# Patient Record
Sex: Female | Born: 1995 | Hispanic: No | Marital: Single
Health system: Southern US, Community
[De-identification: ages and names within clinical notes are randomized; demographics above are authoritative.]

## PROBLEM LIST (undated history)

## (undated) HISTORY — PX: WISDOM TOOTH EXTRACTION: SHX21

---
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
## 2015-02-19 ENCOUNTER — Emergency Department (HOSPITAL_COMMUNITY)
Admission: EM | Admit: 2015-02-19 | Discharge: 2015-02-19 | Disposition: A | Discharge status: Home / Self Care | Payer: Medicaid Other | Attending: Emergency Medicine | Admitting: Emergency Medicine

## 2015-02-19 ENCOUNTER — Encounter (HOSPITAL_COMMUNITY): Payer: Self-pay | Admitting: *Deleted

## 2015-02-19 DIAGNOSIS — Y289XXA Contact with unspecified sharp object, undetermined intent, initial encounter: Secondary | ICD-10-CM | POA: Insufficient documentation

## 2015-02-19 DIAGNOSIS — Y9389 Activity, other specified: Secondary | ICD-10-CM | POA: Insufficient documentation

## 2015-02-19 DIAGNOSIS — S61215A Laceration without foreign body of left ring finger without damage to nail, initial encounter: Secondary | ICD-10-CM | POA: Insufficient documentation

## 2015-02-19 DIAGNOSIS — S6992XA Unspecified injury of left wrist, hand and finger(s), initial encounter: Secondary | ICD-10-CM | POA: Diagnosis present

## 2015-02-19 DIAGNOSIS — IMO0002 Reserved for concepts with insufficient information to code with codable children: Secondary | ICD-10-CM

## 2015-02-19 DIAGNOSIS — Y929 Unspecified place or not applicable: Secondary | ICD-10-CM | POA: Insufficient documentation

## 2015-02-19 DIAGNOSIS — Y999 Unspecified external cause status: Secondary | ICD-10-CM | POA: Diagnosis not present

## 2015-02-19 MED ORDER — LIDOCAINE HCL (PF) 1 % IJ SOLN
5.0000 mL | Freq: Once | INTRAMUSCULAR | Status: AC
  Administered 2015-02-19: 5 mL via INTRADERMAL
  Filled 2015-02-19: qty 5

## 2015-02-19 MED ORDER — TETANUS-DIPHTH-ACELL PERTUSSIS 5-2.5-18.5 LF-MCG/0.5 IM SUSP
0.5000 mL | Freq: Once | INTRAMUSCULAR | Status: AC
  Administered 2015-02-19: 0.5 mL via INTRAMUSCULAR
  Filled 2015-02-19: qty 0.5

## 2015-02-19 NOTE — Discharge Instructions (Signed)
Please return to the Emergency department in 7 days for suture removal.

## 2015-02-19 NOTE — ED Notes (Signed)
Pt reports cutting her LT ring finger one Hr ago . Pt was cutting food .

## 2015-02-19 NOTE — ED Provider Notes (Signed)
CSN: [id number]     Arrival date & time 02/19/15  1444 History  This chart was scribed for non-physician practitioner, Melburn Hake, PA-C working with Glynn Octave, MD by Angelene Giovanni, ED Scribe. The patient was seen in room TR09C/TR09C and the patient's care was started at 3:15 PM    Chief Complaint  Patient presents with  . Laceration   The history is provided by the patient. No language interpreter was used.   HPI Comments: Deborah Larsen is a 19 y.o. female who presents to the Emergency Department complaining of laceration on her left ring finger that occurred an hour ago while she was cutting a tomato. She notes the laceration was bleeding but has since stopped. Endorses pain to the left ring finger. She does not take any medications. Her tetanus vaccine is not UTD.   History reviewed. No pertinent past medical history. Past Surgical History  Procedure Laterality Date  . Wisdom tooth extraction     History reviewed. No pertinent family history. Social History  Substance Use Topics  . Smoking status: Passive Smoke Exposure - Never Smoker  . Smokeless tobacco: None  . Alcohol Use: No   OB History    No data available     Review of Systems  Skin:       Lac on left ring finger  All other systems reviewed and are negative.     Allergies  Review of patient's allergies indicates no known allergies.  Home Medications   Prior to Admission medications   Not on File   BP 105/62 mmHg  Pulse 75  Temp(Src) 98.4 F (36.9 C) (Oral)  Resp 18  Ht 5\' 2"  (1.575 m)  Wt 120 lb (54.432 kg)  BMI 21.94 kg/m2  SpO2 100%  LMP 01/29/2015 Physical Exam  Constitutional: She is oriented to person, place, and time. She appears well-developed and well-nourished.  HENT:  Head: Normocephalic and atraumatic.  Eyes: Conjunctivae and EOM are normal. Right eye exhibits no discharge. Left eye exhibits no discharge. No scleral icterus.  Neck: Normal range of motion.   Cardiovascular: Normal rate.   Pulmonary/Chest: Effort normal. No respiratory distress.  Musculoskeletal: Normal range of motion.  Sensation intact to left 4th digit. Full ROM and 5/5 strength of left digits. Radial pulses 2+.   Neurological: She is alert and oriented to person, place, and time.  Skin: Skin is warm and dry.  1cm laceration to the distal aspect of the left 4th digit, no active bleeding.  Psychiatric: She has a normal mood and affect. Her behavior is normal.  Nursing note and vitals reviewed.   ED Course  Procedures (including critical care time) DIAGNOSTIC STUDIES: Oxygen Saturation is 100% on RA, normal by my interpretation.    COORDINATION OF CARE: 3:18 PM- Pt advised of plan for treatment and pt agrees.    Labs Review Labs Reviewed - No data to display  Imaging Review No results found. I have personally reviewed and evaluated these images and lab results as part of my medical decision-making.  LACERATION REPAIR Performed by: Barrett Henle Authorized by: Barrett Henle Consent: Verbal consent obtained. Risks and benefits: risks, benefits and alternatives were discussed Consent given by: patient Patient identity confirmed: provided demographic data Prepped and Draped in normal sterile fashion Wound explored  Laceration Location: distal left 4th digit  Laceration Length: 1cm  No Foreign Bodies seen or palpated  Anesthesia: local infiltration  Local anesthetic: lidocaine 1% without epinephrine  Anesthetic total: 1 ml  Irrigation method: syringe Amount of cleaning: standard  Skin closure: 6-0 prolene  Number of sutures: 4  Technique: simple interrupted  Patient tolerance: Patient tolerated the procedure well with no immediate complications.  Filed Vitals:   02/19/15 1453  BP: 105/62  Pulse: 75  Temp: 98.4 F (36.9 C)  Resp: 18      MDM   Final diagnoses:  Laceration    Pt presents with laceration to distal  4th left digit. No active bleeding. Tetanus status unknown.  Pt tolerated laceration repair well and procedure was completed without any complications.  Tdap ordered due to unknown tetanus status.  Discussed plan for d/c with pt. Pt given instructions for care of sutures and advised to return to the ED in 7 days to have sutures removed. Pt agrees with plan. Pt advised to return to the ED sooner if sxs worsen or new onset of fever, dec. ROM, numbness, tingling, weakness or bleeding.   I personally performed the services described in this documentation, which was scribed in my presence. The recorded information has been reviewed and is accurate.  Meds given in ED:  Medications  lidocaine (PF) (XYLOCAINE) 1 % injection 5 mL (not administered)  Tdap (BOOSTRIX) injection 0.5 mL (not administered)    New Prescriptions   No medications on file     Barrett Henle, [place] 02/19/15 1634  Arby Barrette, MD 02/24/15 [phone]

## 2015-02-19 NOTE — ED Notes (Signed)
Dressing placed to the left index finger  Patient verbalized understanding of d/c instructions

## 2015-09-08 ENCOUNTER — Emergency Department (HOSPITAL_COMMUNITY)
Admission: EM | Admit: 2015-09-08 | Discharge: 2015-09-08 | Disposition: A | Discharge status: Home / Self Care | Payer: Medicaid Other | Attending: Emergency Medicine | Admitting: Emergency Medicine

## 2015-09-08 ENCOUNTER — Encounter (HOSPITAL_COMMUNITY): Payer: Self-pay | Admitting: Emergency Medicine

## 2015-09-08 DIAGNOSIS — L03011 Cellulitis of right finger: Secondary | ICD-10-CM | POA: Insufficient documentation

## 2015-09-08 DIAGNOSIS — M79641 Pain in right hand: Secondary | ICD-10-CM | POA: Diagnosis present

## 2015-09-08 MED ORDER — CEPHALEXIN 500 MG PO CAPS: 1000.0000 mg | ORAL_CAPSULE | Freq: Two times a day (BID) | ORAL | Status: DC
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
## 2015-09-08 NOTE — Discharge Instructions (Signed)
Apply warm moist compress to affected finger 3-5 times daily for the next several days.  Take antibiotics as prescribed for the full duration.  Take tylenol or ibuprofen as needed for pain.  Return in 2 days if you notice no improvement.  Celulitis (Cellulitis)  La celulitis es una infeccin de la piel y del tejido que se encuentra debajo de la piel. El rea infectada generalmente est de color rojo y duele. Ocurre con ms frecuencia en los brazos y en las piernas. CUIDADOS EN EL HOGAR   Tome los antibiticos tal como se le indic. Finalice el Dan[place]medicamento, aunque comience a Actorsentirse mejor.  Mantenga el brazo o la pierna infectada elevada.  Aplique paos calientes en la zona hasta 4 veces al da.  Tome slo los medicamentos que le haya indicado el mdico.  Cumpla con los controles mdicos segn las indicaciones. SOLICITE AYUDA SI:  Observa una lnea roja en la piel que sale desde la herida.  El rea roja se extiende o se vuelve de color oscuro.  El hueso o la articulacin que se encuentran por debajo de la zona infectada le duelen despus de que la piel se [place].  La infeccin se repite en la misma zona o en una zona diferente.  Tiene un bulto inflamado (hinchado) en la zona infectada.  Aparecen nuevos sntomas.  Tiene fiebre. SOLICITE AYUDA DE INMEDIATO SI:   Se siente muy somnoliento.  Vomita o tiene diarrea.  Se siente enfermo y tiene Smith Internationaldolores musculares.   Esta informacin no tiene Theme park managercomo fin reemplazar el consejo del mdico. Asegrese de hacerle al mdico cualquier pregunta que tenga.   Document Released: 12/06/2009 Document Revised: 03/09/2015 Elsevier Interactive Patient Education [employer]2016 Elsevier Inc.
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
## 2018-07-02 NOTE — L&D Delivery Note (Signed)
Delivery Note   Patient Name: Deborah Larsen DOB: August 03, 1995 MRN: [id number]  Date of admission: 04/05/2019 Delivering MD: Noralyn Pick  Date of delivery: 04/05/19 Type of delivery: SVD  Newborn Data: Live born female  Birth Weight:   APGAR: 93, 75  Newborn Delivery   Birth date/time: 04/05/2019 17:41:00 Delivery type: Vaginal, Spontaneous     Deborah Larsen, 23 y.o., @ [redacted]w[redacted]d,  G1P0, who was admitted for PROM with spontaneous labor. I was called to the room when she progressed +1 station and 9cm dilated. Pt having all natural birth and was coached to breath through her cxt, but involuntary was pushing. Pt then progressed to +2 station @ 10 cm in the second stage of labor.  She pushed for 2hours.  She delivered a viable infant, cephalic and restituted to the ROT position over an intact perineum.  A loose nuchal cord   was identified and newborn somersaulted through. The baby was placed on maternal abdomen while initial step of NRP were perfmored (Dry, Stimulated, and warmed). Hat placed on baby for thermoregulation. Delayed cord clamping was performed for 2 minutes.  Cord double clamped and cut.  Cord cut by father. Apgar scores were 7 and 9. Prophylactic Pitocin was started in the third stage of labor for active management. Red robin I&O was performed to collect another PCR. The placenta delivered spontaneously, shultz, with a 3 vessel cord and was sent to LD.  Inspection revealed perineal tear, right labial tear, hemostatic no repaired required, pt declines repair also. An examination of the vaginal vault and cervix was free from lacerations. The uterus was boggy and required fundal massage then was firm, bleeding stable.   Placenta and umbilical artery blood gas were not sent.  There were no complications during the procedure.  Mom and baby skin to skin following delivery. Left in stable condition.  Pt stable denies HA, RUQ pain, no vision changes, patellar DTR+2, no clonus. PCR  resulted 2.42, but was clean catch and pt was bleeding at the time of collection, cmp was redrawn and pending, platelets were 140s. See VSS below for BP immediately PP.   BP 112/81   Pulse (!) 103   Temp 98 F (36.7 C) (Oral)   Resp 19   Ht 5\' 2"  (1.575 m)   Wt 73.7 kg   LMP 07/07/2018   SpO2 99%   BMI 29.70 kg/m   Maternal Info: Anesthesia: None Episiotomy: No Lacerations:  perineal tear, right labial tear, hemostatic no repaired required, pt declines repair also.  Suture Repair: No Est. Blood Loss (mL):  445mls  Newborn Info:  Baby Sex: female Circumcision: No, declines Babies Name: [place] APGAR (1 MIN): 7   APGAR (5 MINS): 9   APGAR (10 MINS):     Mom to postpartum.  Baby to Couplet care / Skin to Skin.  DR Simona Huh updated on birth.    [place], [place], NP-C 04/05/19 6:13 PM

## 2018-10-21 LAB — OB RESULTS CONSOLE RUBELLA ANTIBODY, IGM: Rubella: UNDETERMINED

## 2018-10-21 LAB — OB RESULTS CONSOLE RPR: RPR: NONREACTIVE

## 2018-10-21 LAB — OB RESULTS CONSOLE HEPATITIS B SURFACE ANTIGEN: Hepatitis B Surface Ag: NEGATIVE

## 2018-10-22 LAB — OB RESULTS CONSOLE GC/CHLAMYDIA
Chlamydia: NEGATIVE
Gonorrhea: NEGATIVE

## 2018-10-22 LAB — OB RESULTS CONSOLE HIV ANTIBODY (ROUTINE TESTING): HIV: NONREACTIVE

## 2019-01-16 ENCOUNTER — Encounter (HOSPITAL_COMMUNITY): Payer: Self-pay | Admitting: *Deleted

## 2019-01-16 ENCOUNTER — Other Ambulatory Visit: Payer: Self-pay

## 2019-01-16 ENCOUNTER — Inpatient Hospital Stay (HOSPITAL_COMMUNITY)
Admission: AD | Admit: 2019-01-16 | Discharge: 2019-01-16 | Disposition: A | Discharge status: Home / Self Care | Payer: Medicaid Other | Attending: Obstetrics & Gynecology | Admitting: Obstetrics & Gynecology

## 2019-01-16 DIAGNOSIS — Z3A27 27 weeks gestation of pregnancy: Secondary | ICD-10-CM | POA: Insufficient documentation

## 2019-01-16 DIAGNOSIS — O368120 Decreased fetal movements, second trimester, not applicable or unspecified: Secondary | ICD-10-CM | POA: Diagnosis not present

## 2019-01-16 DIAGNOSIS — Z7722 Contact with and (suspected) exposure to environmental tobacco smoke (acute) (chronic): Secondary | ICD-10-CM | POA: Insufficient documentation

## 2019-01-16 NOTE — MAU Note (Signed)
.   Deborah Larsen is a 23 y.o. at Unknown here in MAU reporting:  A decrease in fetal movement for 2 days. PT denies any other complaints  Onset of complaint: 2 days Pain score: 0 Vitals:   01/16/19 1406  BP: (!) 109/58  Pulse: 76  Resp: 16  Temp: 98.7 F (37.1 C)  SpO2: 99%     FHT:145 Lab orders placed from triage:

## 2019-01-16 NOTE — MAU Provider Note (Signed)
History   [id number]   Chief Complaint  Patient presents with  . Decreased Fetal Movement    HPI Deborah Larsen is a 23 y.o. female  G1P0 here with report of decreased fetal movement since 2 days ago.  Reports feeling the baby move approximately 18+ times in the past 24 hour. Pt states she's been feeling the baby move as frequently but the movement is not as "strong" as previously. Denies contractions, vaginal bleeding, or leaking of fluid.  Patient's last menstrual period was 07/07/2018.  OB History  Gravida Para Term Preterm AB Living  1            SAB TAB Ectopic Multiple Live Births               # Outcome Date GA Lbr Len/2nd Weight Sex Delivery Anes PTL Lv  1 Current             History reviewed. No pertinent past medical history.  History reviewed. No pertinent family history.  Social History   Socioeconomic History  . Marital status: Single    Spouse name: Not on file  . Number of children: Not on file  . Years of education: Not on file  . Highest education level: Not on file  Occupational History  . Not on file  Social Needs  . Financial resource strain: Not on file  . Food insecurity    Worry: Not on file    Inability: Not on file  . Transportation needs    Medical: Not on file    Non-medical: Not on file  Tobacco Use  . Smoking status: Passive Smoke Exposure - Never Smoker  . Smokeless tobacco: Never Used  Substance and Sexual Activity  . Alcohol use: No  . Drug use: No  . Sexual activity: Not on file  Lifestyle  . Physical activity    Days per week: Not on file    Minutes per session: Not on file  . Stress: Not on file  Relationships  . Social Herbalist on phone: Not on file    Gets together: Not on file    Attends religious service: Not on file    Active member of club or organization: Not on file    Attends meetings of clubs or organizations: Not on file    Relationship status: Not on file  Other Topics Concern  . Not  on file  Social History Narrative  . Not on file    No Known Allergies  No current facility-administered medications on file prior to encounter.    Current Outpatient Medications on File Prior to Encounter  Medication Sig Dispense Refill  . Prenatal Vit-Fe Fumarate-FA (PRENATAL MULTIVITAMIN) TABS tablet Take 1 tablet by mouth daily at 12 noon.       Review of Systems  Constitutional: Negative.   Gastrointestinal: Negative.   Genitourinary: Negative.    Physical Exam   Vitals:   01/16/19 1406  BP: (!) 109/58  Pulse: 76  Resp: 16  Temp: 98.7 F (37.1 C)  SpO2: 99%    Physical Exam  Nursing note and vitals reviewed. Constitutional: She appears well-developed and well-nourished. No distress.  GI: Soft. There is no abdominal tenderness.  Skin: Skin is warm and dry. She is not diaphoretic.  Psychiatric: She has a normal mood and affect. Her behavior is normal. Judgment and thought content normal.   NST:  Baseline: 145 bpm, Variability: Good {> 6 bpm), Accelerations: Reactive and Decelerations:  Absent MAU Course  Procedures  MDM Category 1 tracing with active movement. Patient reports feeling good fetal movement. Discussed expectations of fetal movement for the remainder of the pregnancy.   Assessment and Plan  A: 1. Decreased fetal movements in second trimester, single or unspecified fetus   2. [redacted] weeks gestation of pregnancy    P: Discharge home Discussed reasons to return to MAU   Judeth HornLawrence, Lavell Ridings, NP 01/16/2019 3:12 PM

## 2019-01-16 NOTE — Discharge Instructions (Signed)

## 2019-03-20 LAB — OB RESULTS CONSOLE GBS: GBS: NEGATIVE

## 2019-03-23 LAB — OB RESULTS CONSOLE GBS: GBS: NEGATIVE

## 2019-04-05 ENCOUNTER — Other Ambulatory Visit: Payer: Self-pay

## 2019-04-05 ENCOUNTER — Encounter (HOSPITAL_COMMUNITY): Payer: Self-pay

## 2019-04-05 ENCOUNTER — Inpatient Hospital Stay (HOSPITAL_COMMUNITY)
Admission: AD | Admit: 2019-04-05 | Discharge: 2019-04-08 | DRG: 806 | Disposition: A | Discharge status: Home / Self Care | Payer: Medicaid Other | Attending: Obstetrics and Gynecology | Admitting: Obstetrics and Gynecology

## 2019-04-05 DIAGNOSIS — Z3A38 38 weeks gestation of pregnancy: Secondary | ICD-10-CM | POA: Diagnosis not present

## 2019-04-05 DIAGNOSIS — O4292 Full-term premature rupture of membranes, unspecified as to length of time between rupture and onset of labor: Principal | ICD-10-CM | POA: Diagnosis present

## 2019-04-05 DIAGNOSIS — Z20828 Contact with and (suspected) exposure to other viral communicable diseases: Secondary | ICD-10-CM | POA: Diagnosis present

## 2019-04-05 DIAGNOSIS — O4290 Premature rupture of membranes, unspecified as to length of time between rupture and onset of labor, unspecified weeks of gestation: Secondary | ICD-10-CM | POA: Diagnosis present

## 2019-04-05 DIAGNOSIS — O1414 Severe pre-eclampsia complicating childbirth: Secondary | ICD-10-CM | POA: Diagnosis present

## 2019-04-05 DIAGNOSIS — O9081 Anemia of the puerperium: Secondary | ICD-10-CM | POA: Diagnosis not present

## 2019-04-05 DIAGNOSIS — O1415 Severe pre-eclampsia, complicating the puerperium: Secondary | ICD-10-CM | POA: Diagnosis not present

## 2019-04-05 LAB — CBC
HCT: 34.7 % — ABNORMAL LOW (ref 36.0–46.0)
Hemoglobin: 11.6 g/dL — ABNORMAL LOW (ref 12.0–15.0)
MCH: 28.5 pg (ref 26.0–34.0)
MCHC: 33.4 g/dL (ref 30.0–36.0)
MCV: 85.3 fL (ref 80.0–100.0)
Platelets: 142 10*3/uL — ABNORMAL LOW (ref 150–400)
RBC: 4.07 MIL/uL (ref 3.87–5.11)
RDW: 13.1 % (ref 11.5–15.5)
WBC: 8.6 10*3/uL (ref 4.0–10.5)
nRBC: 0 % (ref 0.0–0.2)

## 2019-04-05 LAB — COMPREHENSIVE METABOLIC PANEL
ALT: 18 U/L (ref 0–44)
AST: 36 U/L (ref 15–41)
Albumin: 2.8 g/dL — ABNORMAL LOW (ref 3.5–5.0)
Alkaline Phosphatase: 128 U/L — ABNORMAL HIGH (ref 38–126)
Anion gap: 14 (ref 5–15)
BUN: 5 mg/dL — ABNORMAL LOW (ref 6–20)
CO2: 16 mmol/L — ABNORMAL LOW (ref 22–32)
Calcium: 8.5 mg/dL — ABNORMAL LOW (ref 8.9–10.3)
Chloride: 106 mmol/L (ref 98–111)
Creatinine, Ser: 0.97 mg/dL (ref 0.44–1.00)
GFR calc Af Amer: >60 mL/min (ref >60)
GFR calc non Af Amer: >60 mL/min (ref >60)
Glucose, Bld: 115 mg/dL — ABNORMAL HIGH (ref 70–99)
Potassium: 4 mmol/L (ref 3.5–5.1)
Sodium: 136 mmol/L (ref 135–145)
Total Bilirubin: 1.1 mg/dL (ref 0.3–1.2)
Total Protein: 6.3 g/dL — ABNORMAL LOW (ref 6.5–8.1)

## 2019-04-05 LAB — POCT FERN TEST: POCT Fern Test: POSITIVE

## 2019-04-05 LAB — PROTEIN / CREATININE RATIO, URINE
Creatinine, Urine: 144.63 mg/dL
Creatinine, Urine: 107.58 mg/dL
Protein Creatinine Ratio: 2.42 mg/mg{Cre} — ABNORMAL HIGH (ref 0.00–0.15)
Protein Creatinine Ratio: 3.72 mg/mg{Cre} — ABNORMAL HIGH (ref 0.00–0.15)
Total Protein, Urine: 350 mg/dL
Total Protein, Urine: 400 mg/dL

## 2019-04-05 LAB — LACTATE DEHYDROGENASE: LDH: 209 U/L — ABNORMAL HIGH (ref 98–192)

## 2019-04-05 LAB — SARS CORONAVIRUS 2 BY RT PCR (HOSPITAL ORDER, PERFORMED IN ~~LOC~~ HOSPITAL LAB): SARS Coronavirus 2: NEGATIVE

## 2019-04-05 LAB — URIC ACID: Uric Acid, Serum: 6.4 mg/dL (ref 2.5–7.1)

## 2019-04-05 LAB — ABO/RH: ABO/RH(D): O POS

## 2019-04-05 LAB — RPR: RPR Ser Ql: NONREACTIVE

## 2019-04-05 MED ORDER — SENNOSIDES-DOCUSATE SODIUM 8.6-50 MG PO TABS
2.0000 | ORAL_TABLET | ORAL | Status: DC
  Administered 2019-04-06 – 2019-04-07 (×3): 2 via ORAL
  Filled 2019-04-05 (×3): qty 2

## 2019-04-05 MED ORDER — LACTATED RINGERS IV SOLN
500.0000 mL | INTRAVENOUS | Status: DC | PRN
  Administered 2019-04-05: 1000 mL via INTRAVENOUS

## 2019-04-05 MED ORDER — OXYTOCIN BOLUS FROM INFUSION
500.0000 mL | Freq: Once | INTRAVENOUS | Status: AC
  Administered 2019-04-05: 500 mL via INTRAVENOUS

## 2019-04-05 MED ORDER — DIBUCAINE (PERIANAL) 1 % EX OINT: 1.0000 "application " | TOPICAL_OINTMENT | CUTANEOUS | Status: DC | PRN

## 2019-04-05 MED ORDER — PRENATAL MULTIVITAMIN CH
1.0000 | ORAL_TABLET | Freq: Every day | ORAL | Status: DC
  Administered 2019-04-06 – 2019-04-07 (×2): 1 via ORAL
  Filled 2019-04-05 (×2): qty 1

## 2019-04-05 MED ORDER — OXYTOCIN 40 UNITS IN NORMAL SALINE INFUSION - SIMPLE MED
2.5000 [IU]/h | INTRAVENOUS | Status: DC
  Filled 2019-04-05: qty 1000

## 2019-04-05 MED ORDER — SIMETHICONE 80 MG PO CHEW: 80.0000 mg | CHEWABLE_TABLET | ORAL | Status: DC | PRN

## 2019-04-05 MED ORDER — ZOLPIDEM TARTRATE 5 MG PO TABS: 5.0000 mg | ORAL_TABLET | Freq: Every evening | ORAL | Status: DC | PRN

## 2019-04-05 MED ORDER — SOD CITRATE-CITRIC ACID 500-334 MG/5ML PO SOLN: 30.0000 mL | ORAL | Status: DC | PRN

## 2019-04-05 MED ORDER — IBUPROFEN 600 MG PO TABS
600.0000 mg | ORAL_TABLET | Freq: Four times a day (QID) | ORAL | Status: DC
  Administered 2019-04-06 – 2019-04-08 (×9): 600 mg via ORAL
  Filled 2019-04-05 (×9): qty 1

## 2019-04-05 MED ORDER — OXYCODONE-ACETAMINOPHEN 5-325 MG PO TABS: 1.0000 | ORAL_TABLET | ORAL | Status: DC | PRN

## 2019-04-05 MED ORDER — ONDANSETRON HCL 4 MG PO TABS: 4.0000 mg | ORAL_TABLET | ORAL | Status: DC | PRN

## 2019-04-05 MED ORDER — ACETAMINOPHEN 325 MG PO TABS: 650.0000 mg | ORAL_TABLET | ORAL | Status: DC | PRN

## 2019-04-05 MED ORDER — TETANUS-DIPHTH-ACELL PERTUSSIS 5-2.5-18.5 LF-MCG/0.5 IM SUSP: 0.5000 mL | Freq: Once | INTRAMUSCULAR | Status: DC

## 2019-04-05 MED ORDER — ONDANSETRON HCL 4 MG/2ML IJ SOLN: 4.0000 mg | Freq: Four times a day (QID) | INTRAMUSCULAR | Status: DC | PRN

## 2019-04-05 MED ORDER — COCONUT OIL OIL: 1.0000 "application " | TOPICAL_OIL | Status: DC | PRN

## 2019-04-05 MED ORDER — ONDANSETRON HCL 4 MG/2ML IJ SOLN: 4.0000 mg | INTRAMUSCULAR | Status: DC | PRN

## 2019-04-05 MED ORDER — BENZOCAINE-MENTHOL 20-0.5 % EX AERO
1.0000 "application " | INHALATION_SPRAY | CUTANEOUS | Status: DC | PRN
  Administered 2019-04-05: 1 via TOPICAL
  Filled 2019-04-05: qty 56

## 2019-04-05 MED ORDER — LIDOCAINE HCL (PF) 1 % IJ SOLN
30.0000 mL | INTRAMUSCULAR | Status: AC | PRN
  Administered 2019-04-05: 30 mL via SUBCUTANEOUS
  Filled 2019-04-05: qty 30

## 2019-04-05 MED ORDER — LACTATED RINGERS IV SOLN
INTRAVENOUS | Status: DC
  Administered 2019-04-05: 12:00:00 via INTRAVENOUS

## 2019-04-05 MED ORDER — OXYCODONE-ACETAMINOPHEN 5-325 MG PO TABS: 2.0000 | ORAL_TABLET | ORAL | Status: DC | PRN

## 2019-04-05 MED ORDER — DIPHENHYDRAMINE HCL 25 MG PO CAPS: 25.0000 mg | ORAL_CAPSULE | Freq: Four times a day (QID) | ORAL | Status: DC | PRN

## 2019-04-05 MED ORDER — MISOPROSTOL 200 MCG PO TABS
ORAL_TABLET | ORAL | Status: AC
  Filled 2019-04-05: qty 4

## 2019-04-05 MED ORDER — WITCH HAZEL-GLYCERIN EX PADS: 1.0000 "application " | MEDICATED_PAD | CUTANEOUS | Status: DC | PRN

## 2019-04-05 NOTE — Progress Notes (Signed)
Labor Progress Note  Matraca Hunkins is a 23 y.o. female, G1P0000, IUP at 38.6 weeks, presenting for PROM # 0300 on 10/4, then pt started feeling cxt after. PNH: Rubella non-immune, baby female, no circ desired, otherwise uncomplicated. Pt endorse + Fm.  Denies vaginal bleeding.   Subjective: Pt in bed in NAD, pt stable with husband at bedside. Pt feeling cxt, but able to breathing, talk and giggle through them. Pt declining pain meds at this time.   Patient Active Problem List   Diagnosis Date Noted  . PROM (premature rupture of membranes) 04/05/2019   Objective: BP 119/87   Pulse 72   Temp 98.4 F (36.9 C) (Oral)   Resp 17   Ht 5\' 2"  (1.575 m)   Wt 73.7 kg   LMP 07/07/2018   SpO2 99%   BMI 29.70 kg/m  No intake/output data recorded. No intake/output data recorded. NST: FHR baseline 130 bpm, Variability: moderate, Accelerations:present, Decelerations:  Absent= Cat 1/Reactive CTX:  regular, every 2-4 minutes, lasting 50-100 seconds Uterus gravid, soft non tender, moderate to palpate with contractions.  SVE:  Dilation: 4.5 Effacement (%): 90 Station: 0 Exam by:: [place], CNM  Assessment:  Dearra Myhand is a 23 y.o. female, G1P0000, IUP at 38.6 weeks, presenting for PROM # 0300 on 10/4, then pt started feeling cxt after. PNH: Rubella non-immune, baby female, no circ desired, otherwise uncomplicated. Pt endorse + Fm.  Denies vaginal bleeding. Progressing natural in latent labor, tolerating natural labor very well.  Patient Active Problem List   Diagnosis Date Noted  . PROM (premature rupture of membranes) 04/05/2019   NICHD: Category 1  Membranes:  PROM @ 0300 on 10/4 x 7hrs, no s/s of infection  Pain management:               IV pain management: x0             Epidural placement:  PRN: not placed, declines wanting.   GBS Negative  Plan: Continue labor plan Continuous/intermittent monitoring Rest Ambulate Frequent position changes to facilitate fetal  rotation and descent. Will reassess with cervical exam at 1330 or earlier if necessary Continue pitocin per protocol Anticipate labor progression and vaginal delivery.   Noralyn Pick, NP-C, CNM, MSN 04/05/2019. 9:43 AM

## 2019-04-05 NOTE — Progress Notes (Signed)
Labor Progress Note  Deborah Larsen is a 23 y.o. female, G1P0000, IUP at 38.6 weeks, presenting for PROM # 0300 on 10/4, then pt started feeling cxt after. PNH: Rubella non-immune, baby female, no circ desired, otherwise uncomplicated. Pt endorse + Fm.  Denies vaginal bleeding.   Subjective: Pt in bed in NAD, pt stable with husband at bedside. Pt feeling cxt and endorses they are more intense now. Pt having to pace breath through them. Pt still declines pain meds and tolerating labor well. RN reported several elevated BP in 140/90s, pt denies HA< vision changes, no RUQ pain.  Patient Active Problem List   Diagnosis Date Noted  . PROM (premature rupture of membranes) 04/05/2019   Objective: BP (!) 146/94   Pulse 68   Temp 98.7 F (37.1 C) (Oral)   Resp 18   Ht 5\' 2"  (1.575 m)   Wt 73.7 kg   LMP 07/07/2018   SpO2 99%   BMI 29.70 kg/m  No intake/output data recorded. No intake/output data recorded. NST: FHR baseline 130 bpm, Variability: moderate, Accelerations:present, Decelerations:  Absent= Cat 1/Reactive CTX:  regular, every 2-4 minutes, lasting 50-100 seconds Uterus gravid, soft non tender, moderate to palpate with contractions.  SVE:  Dilation: 5.5 Effacement (%): 100 Station: 0 Exam by:: Deborah Larsen and Deborah Larsen, RNs   Assessment:  Deborah Larsen is a 23 y.o. female, G1P0000, IUP at 38.6 weeks, presenting for PROM # 0300 on 10/4, then pt started feeling cxt after. PNH: Rubella non-immune, baby female, no circ desired, otherwise uncomplicated. Pt endorse + Fm.  Denies vaginal bleeding. Progressing natural in active labor, tolerating natural labor very well.  Patient Active Problem List   Diagnosis Date Noted  . PROM (premature rupture of membranes) 04/05/2019   NICHD: Category 1  Membranes:  PROM @ 0300 on 10/4 x 7hrs, no s/s of infection  Pain management:               IV pain management: x0             Epidural placement:  PRN: not placed, declines  wanting.   GBS Negative  Elevated BP: Most likely due to pain with cxt. No pain control desired. Asymptomatic.   Plan: Continue labor plan Elevated BP: Labs PCR, CMP.  Continuous/intermittent monitoring Rest Ambulate Frequent position changes to facilitate fetal rotation and descent. Will reassess with cervical exam at 1330 or earlier if necessary Anticipate labor progression and vaginal delivery.   Noralyn Pick, NP-C, CNM, MSN 04/05/2019. 12:15 PM

## 2019-04-05 NOTE — Lactation Note (Signed)
This note was copied from a baby's chart. Lactation Consultation Note  Patient Name: Deborah Larsen [id number] Date: 04/05/2019 Reason for consult: Initial assessment;Primapara;1st time breastfeeding;Early term 52-38.6wks  5 hours old ETI female who is being exclusively BF by his mother, she's a P1. Mom reported (+) breast changes during the pregnancy. She's familiar with hand expression and has already spoon fed baby with the help of her RN, baby has been having a difficult latch since birth, but mom has plenty of colostrum. She was able to hand express a few drops, LC showed parents how to finger feed baby. LC noticed that mom's nipples are "dimpled" but everted and they compress upon stimulation.  Offered assistance with latch and mom agreed to wake baby up to feed. LC took baby STS to mother's left breast in cross cradle position, he was able to open big and "take the breast" but no sucking reflex elicit at the breast, baby would just cry and "sit" his mouth at the breast.   LC tried some suck training, baby would eventually suck on a gloved finger after lots of stimulation but he will not suck at the breast, every time LC hand express and put baby back to breast, he'd do a few sucks and then stop, he did that for about 5-7 minutes. Reviewed normal newborn behavior, feeding cues, lactogenesis II, size of baby's stomach and cluster feeding.  Feeding plan:  1. Encouraged mom to feed baby STS 8-12 times/24 hours or sooner if feeding cues are present 2. Hand expression and spoon feeding were also encouraged, especially if baby is having difficulty latching on  BF brochure (SP), BF resources (SP) and feeding diary (SP) were reviewed. Parents reported all questions and concerns were answered, they're both aware of [facility] services and will call PRN.  Maternal Data Formula Feeding for Exclusion: Yes Reason for exclusion: Mother's choice to formula and breast feed on admission Has patient  been taught Hand Expression?: Yes Does the patient have breastfeeding experience prior to this delivery?: No  Feeding Feeding Type: Breast Fed  LATCH Score Latch: Repeated attempts needed to sustain latch, nipple held in mouth throughout feeding, stimulation needed to elicit sucking reflex.  Audible Swallowing: None  Type of Nipple: Everted at rest and after stimulation(they are everted but "dimpled" and evert upon stimulation)  Comfort (Breast/Nipple): Soft / non-tender  Hold (Positioning): Assistance needed to correctly position infant at breast and maintain latch.  LATCH Score: 6  Interventions Interventions: Breast feeding basics reviewed;Assisted with latch;Skin to skin;Breast massage;Hand express;Breast compression;Adjust position;Support pillows  Lactation Tools Discussed/Used WIC Program: No   Consult Status Consult Status: Follow-up Date: 04/06/19 Follow-up type: In-patient    Shandricka Monroy Francene Boyers 04/05/2019, 11:01 PM

## 2019-04-05 NOTE — H&P (Signed)
Deborah Larsen is a 23 y.o. female, G1P0000, IUP at 38.6 weeks, presenting for PROM # 0300 on 10/4, then pt started feeling cxt after. PNH: Rubella non-immune, baby female, no circ desired, otherwise uncomplicated. Pt endorse + Fm.  Denies vaginal bleeding.   Patient Active Problem List   Diagnosis Date Noted  . PROM (premature rupture of membranes) 04/05/2019     Medications Prior to Admission  Medication Sig Dispense Refill Last Dose  . Prenatal Vit-Fe Fumarate-FA (PRENATAL MULTIVITAMIN) TABS tablet Take 1 tablet by mouth daily at 12 noon.   04/04/2019 at 1000    History reviewed. No pertinent past medical history.   No current facility-administered medications on file prior to encounter.    Current Outpatient Medications on File Prior to Encounter  Medication Sig Dispense Refill  . Prenatal Vit-Fe Fumarate-FA (PRENATAL MULTIVITAMIN) TABS tablet Take 1 tablet by mouth daily at 12 noon.       No Known Allergies  History of present pregnancy: Pt Info/Preference:  Screening/Consents:  Labs:   EDD: Estimated Date of Delivery: 04/13/19  Establised: Patient's last menstrual period was 07/07/2018.  Anatomy Scan: Date: 11/26/2018 Placenta Location: anterior Genetic Screen: Panoroma:low risk female AFP: neg First Tri: Quad:  Office: ccob            First PNV: 16.1wg Blood Type --/--/O POS, O POS Performed at [facility], [place] [address], [place],  [address]  [phone]/04 0550)  Language: english Last PNV: 37.6 wg Rhogam  n/a  Flu Vaccine:  utd   Antibody NEG (10/04 0550)  TDaP vaccine utd   GTT: Early: 5.3 hga1c Third Trimester: 107  Feeding Plan: Breast/bottle BTL: no Rubella: Equivocal (04/21 0000)  Contraception: ??? VBAC: no RPR: Nonreactive (04/21 0000)   Circumcision: no   HBsAg: Negative (04/21 0000)  Pediatrician:  ???   HIV: Non-reactive (04/22 0000)   Prenatal Classes: no Additional [place]: 02/03/2019 growth see below GBS: Negative/-- (09/21 0000)(For PCN allergy,  check sensitivities)       Chlamydia: neg    MFM Referral/Consult:  GC: neg  Support Person: husband   PAP: 2018-normal  Pain Management: None planned, options reveiwed Neonatologist Referral:  Hgb Electrophoresis:  AA  Birth Plan: none   Hgb NOB: 12.4    28W: 12.4  02/03/2019 growth:  OB History    Gravida  1   Para      Term      Preterm      AB      Living        SAB      TAB      Ectopic      Multiple      Live Births             History reviewed. No pertinent past medical history. Past Surgical History:  Procedure Laterality Date  . WISDOM TOOTH EXTRACTION     Family History: family history is not on file. Social History:  reports that she is a non-smoker but has been exposed to tobacco smoke. She has never used smokeless tobacco. She reports that she does not drink alcohol or use drugs.   Prenatal Transfer Tool  Maternal Diabetes: No Genetic Screening: Normal Maternal Ultrasounds/Referrals: Normal Fetal Ultrasounds or other Referrals:  None Maternal Substance Abuse:  No Significant Maternal Medications:  None Significant Maternal Lab Results: Group B Strep negative  ROS:  Review of Systems  Constitutional: Negative.   HENT: Negative.   Eyes: Negative.  Respiratory: Negative.   Cardiovascular: Negative.   Gastrointestinal: Positive for abdominal pain.  Genitourinary: Negative.   Musculoskeletal: Negative.   Skin: Negative.   Neurological: Negative.   Endo/Heme/Allergies: Negative.   Psychiatric/Behavioral: Negative.      Physical Exam: BP (!) 144/81   Pulse (!) 57   Temp 98.4 F (36.9 C) (Oral)   Resp 17   Ht 5\' 2"  (1.575 m)   Wt 73.7 kg   LMP 07/07/2018   SpO2 99%   BMI 29.70 kg/m   Physical Exam  Constitutional: She is oriented to person, place, and time and well-developed, well-nourished, and in no distress.  HENT:  Head: Normocephalic and atraumatic.  Eyes: Pupils are equal, round, and reactive to light.  Neck: Normal  range of motion. Neck supple.  Cardiovascular: Normal rate and regular rhythm.  Pulmonary/Chest: Effort normal and breath sounds normal.  Abdominal: Soft. Bowel sounds are normal.  Genitourinary:    Genitourinary Comments: Uterus soft non-tender, pelvis adequate for vaginal delivery   Musculoskeletal: Normal range of motion.  Neurological: She is alert and oriented to person, place, and time. She has normal reflexes. Gait normal.  Skin: Skin is warm and dry.  Psychiatric: Affect normal.  Nursing note and vitals reviewed.    NST: FHR baseline 130 bpm, Variability: moderate, Accelerations:present, Decelerations:  Absent= Cat 1/Reactive UC:   irregular, every 2-6 minutes, lasting 50-120 seconds, mild to palpate SVE:   Dilation: 3.5 Effacement (%): 50 Station: -3 Exam by:: Suezanne JacquetJ Williams, RN, vertex verified by fetal sutures.  Leopold's: Position vertex, EFW 7.5lbs via leopold's.   Labs: Results for orders placed or performed during the hospital encounter of 04/05/19 (from the past 24 hour(s))  Fern Test     Status: None   Collection Time: 04/05/19  5:16 AM  Result Value Ref Range   POCT Fern Test Positive = ruptured amniotic membanes   SARS Coronavirus 2 [facility](Hospital order, Performed in [facility] hospital lab) Nasopharyngeal Nasopharyngeal Swab     Status: None   Collection Time: 04/05/19  5:30 AM   Specimen: Nasopharyngeal Swab  Result Value Ref Range   SARS Coronavirus 2 NEGATIVE NEGATIVE  CBC     Status: Abnormal   Collection Time: 04/05/19  5:50 AM  Result Value Ref Range   WBC 8.6 4.0 - 10.5 K/uL   RBC 4.07 3.87 - 5.11 MIL/uL   Hemoglobin 11.6 (L) 12.0 - 15.0 g/dL   HCT 95.634.7 (L) 21.336.0 - 08.646.0 %   MCV 85.3 80.0 - 100.0 fL   MCH 28.5 26.0 - 34.0 pg   MCHC 33.4 30.0 - 36.0 g/dL   RDW 57.813.1 46.911.5 - 62.915.5 %   Platelets 142 (L) 150 - 400 K/uL   nRBC 0.0 0.0 - 0.2 %  Type and screen [facility]     Status: None   Collection Time: 04/05/19  5:50 AM  Result Value Ref  Range   ABO/RH(D) O POS    Antibody Screen NEG    Sample Expiration      04/08/2019,2359 Performed at [facility], [address] [place], [place] [address]   ABO/Rh     Status: None (Preliminary result)   Collection Time: 04/05/19  5:50 AM  Result Value Ref Range   ABO/RH(D)      O POS Performed at [facility], [address] [place], [place] [address]     Imaging:  No results found.  MAU Course: Orders Placed This Encounter  Procedures  . SARS Coronavirus 2 [facility] order, Performed in [facility] hospital lab) Nasopharyngeal Nasopharyngeal Swab  . OB RESULT CONSOLE Group B Strep  . OB RESULT CONSOLE Group B Strep  . CBC  . RPR  . OB RESULTS CONSOLE GC/Chlamydia  . OB RESULTS CONSOLE RPR  . OB RESULTS CONSOLE HIV antibody  . OB RESULTS CONSOLE Rubella Antibody  . OB RESULTS CONSOLE Hepatitis B surface antigen  . Diet clear liquid Room service appropriate? Yes; Fluid consistency: Thin  . Vitals signs per unit policy  . Notify Physician  . Fetal monitoring per unit policy  . Activity as tolerated  . Cervical Exam  . Measure blood pressure post delivery every 15 min x 1 hour then every 30 min x 1 hour  . Fundal check post delivery every 15 min x 1 hour then every 30 min x 1 hour  . If Rapid HIV test positive or known HIV positive: initiate AZT orders  . May in and out cath x 2 for inability to void  . Insert foley catheter  . Discontinue foley prior to vaginal delivery  . Initiate Carrier Fluid Protocol  . Initiate Oral Care Protocol  . Order Rapid HIV per protocol if no results on chart  . Practitioner attestation of consent  . Patient may have epidural placement upon request  . Full code  . Fern Test  . Type and screen [facility] (1-Rh)  . ABO/Rh  . Insert and maintain IV Line  . Admit to Inpatient (patient's expected length of stay will be greater than 2 midnights or inpatient only procedure)   Meds ordered this encounter   Medications  . lactated ringers infusion  . oxytocin (PITOCIN) IV BOLUS FROM BAG  . oxytocin (PITOCIN) IV infusion 40 units in NS 1000 mL - Premix  . lactated ringers infusion 500-1,000 mL  . acetaminophen (TYLENOL) tablet 650 mg  . oxyCODONE-acetaminophen (PERCOCET/ROXICET) 5-325 MG per tablet 1 tablet  . oxyCODONE-acetaminophen (PERCOCET/ROXICET) 5-325 MG per tablet 2 tablet  . ondansetron (ZOFRAN) injection 4 mg  . sodium citrate-citric acid (ORACIT) solution 30 mL  . lidocaine (PF) (XYLOCAINE) 1 % injection 30 mL    Assessment/Plan: Deborah Larsen is a 23 y.o. female, G1P0000, IUP at 38.6 weeks, presenting for PROM # 0300 on 10/4, then pt started feeling cxt after. PNH: Rubella non-immune, baby female, no circ desired, GBS-, otherwise uncomplicated. Pt desired all natural delivery. Pt endorse + Fm.  Denies vaginal bleeding.   I discussed with patient risks, benefits and alternatives of labor aumention including higher risk of cesarean delivery compared to spontaneous labor.  We discussed risks of induction agents including effects on fetal heart beat, contraction pattern and need for close monitoring.  Patient expressed understanding of all this and desired to proceed with the expectant management. Risks and benefits of induction were reviewed, including failure of method, prolonged labor, need for further intervention, risk of cesarean.  Patient and family verbalized understanding and denies any further questions at this time. Pt and family wish to proceed with expected management process. Discussed induction options of pitocin were reviewed as well as risks and benefits with use of each discussed.  FWB: Cat 1 Fetal Tracing.   Plan: Admit to Birthing Suite per consult with DR Dion Body Routine [facility] orders Pain med/epidural prn Expectant management  Anticipate starting pitocin if indicated per pts consent.  Anticipate labor progression   Dale Secaucus NP-C, CNM, MSN 04/05/2019,  7:05 AM

## 2019-04-05 NOTE — MAU Note (Signed)
Pt present to MAU for water broken, she states she woke up from sleeping at 3am with water trickling out and it has continued to come out since, she has good fetal movement, and has some light watery pink when she wipes. She states she is feeling some contractions.

## 2019-04-06 DIAGNOSIS — O1415 Severe pre-eclampsia, complicating the puerperium: Secondary | ICD-10-CM | POA: Diagnosis not present

## 2019-04-06 LAB — COMPREHENSIVE METABOLIC PANEL
ALT: 22 U/L (ref 0–44)
AST: 59 U/L — ABNORMAL HIGH (ref 15–41)
Albumin: 2.1 g/dL — ABNORMAL LOW (ref 3.5–5.0)
Alkaline Phosphatase: 96 U/L (ref 38–126)
Anion gap: 8 (ref 5–15)
BUN: 5 mg/dL — ABNORMAL LOW (ref 6–20)
CO2: 21 mmol/L — ABNORMAL LOW (ref 22–32)
Calcium: 8 mg/dL — ABNORMAL LOW (ref 8.9–10.3)
Chloride: 105 mmol/L (ref 98–111)
Creatinine, Ser: 0.98 mg/dL (ref 0.44–1.00)
GFR calc Af Amer: >60 mL/min (ref >60)
GFR calc non Af Amer: >60 mL/min (ref >60)
Glucose, Bld: 109 mg/dL — ABNORMAL HIGH (ref 70–99)
Potassium: 4 mmol/L (ref 3.5–5.1)
Sodium: 134 mmol/L — ABNORMAL LOW (ref 135–145)
Total Bilirubin: 0.3 mg/dL (ref 0.3–1.2)
Total Protein: 4.7 g/dL — ABNORMAL LOW (ref 6.5–8.1)

## 2019-04-06 LAB — CBC
HCT: 27.8 % — ABNORMAL LOW (ref 36.0–46.0)
Hemoglobin: 9.6 g/dL — ABNORMAL LOW (ref 12.0–15.0)
MCH: 29.1 pg (ref 26.0–34.0)
MCHC: 34.5 g/dL (ref 30.0–36.0)
MCV: 84.2 fL (ref 80.0–100.0)
Platelets: 154 10*3/uL (ref 150–400)
RBC: 3.3 MIL/uL — ABNORMAL LOW (ref 3.87–5.11)
RDW: 13.3 % (ref 11.5–15.5)
WBC: 17.3 10*3/uL — ABNORMAL HIGH (ref 4.0–10.5)
nRBC: 0 % (ref 0.0–0.2)

## 2019-04-06 LAB — URIC ACID: Uric Acid, Serum: 6.7 mg/dL (ref 2.5–7.1)

## 2019-04-06 LAB — LACTATE DEHYDROGENASE: LDH: 217 U/L — ABNORMAL HIGH (ref 98–192)

## 2019-04-06 MED ORDER — DIPHENOXYLATE-ATROPINE 2.5-0.025 MG PO TABS
2.0000 | ORAL_TABLET | Freq: Once | ORAL | Status: AC
  Administered 2019-04-06: 2 via ORAL
  Filled 2019-04-06: qty 2

## 2019-04-06 MED ORDER — CARBOPROST TROMETHAMINE 250 MCG/ML IM SOLN
INTRAMUSCULAR | Status: AC
  Administered 2019-04-06: 03:00:00 250 ug
  Filled 2019-04-06: qty 1

## 2019-04-06 MED ORDER — HYDRALAZINE HCL 20 MG/ML IJ SOLN: 10.0000 mg | INTRAMUSCULAR | Status: DC | PRN

## 2019-04-06 MED ORDER — TRANEXAMIC ACID-NACL 1000-0.7 MG/100ML-% IV SOLN
1000.0000 mg | INTRAVENOUS | Status: AC
  Administered 2019-04-06: 1000 mg via INTRAVENOUS

## 2019-04-06 MED ORDER — MAGNESIUM SULFATE 40 G IN LACTATED RINGERS - SIMPLE
2.0000 g/h | INTRAVENOUS | Status: AC
  Administered 2019-04-06 (×2): 2 g/h via INTRAVENOUS
  Filled 2019-04-06 (×4): qty 500

## 2019-04-06 MED ORDER — MISOPROSTOL 200 MCG PO TABS
ORAL_TABLET | ORAL | Status: AC
  Administered 2019-04-06: 1000 ug
  Filled 2019-04-06: qty 5

## 2019-04-06 MED ORDER — METHYLERGONOVINE MALEATE 0.2 MG PO TABS: 0.2000 mg | ORAL_TABLET | Freq: Four times a day (QID) | ORAL | Status: DC

## 2019-04-06 MED ORDER — TRANEXAMIC ACID 1000 MG/10ML IV SOLN
1.5000 mg/kg/h | INTRAVENOUS | Status: DC
  Filled 2019-04-06: qty 25

## 2019-04-06 MED ORDER — TRANEXAMIC ACID-NACL 1000-0.7 MG/100ML-% IV SOLN
INTRAVENOUS | Status: AC
  Filled 2019-04-06: qty 100

## 2019-04-06 MED ORDER — LABETALOL HCL 5 MG/ML IV SOLN: 20.0000 mg | INTRAVENOUS | Status: DC | PRN

## 2019-04-06 MED ORDER — FENTANYL CITRATE (PF) 100 MCG/2ML IJ SOLN
INTRAMUSCULAR | Status: AC
  Administered 2019-04-06: 100 ug
  Filled 2019-04-06: qty 2

## 2019-04-06 MED ORDER — LABETALOL HCL 5 MG/ML IV SOLN: 40.0000 mg | INTRAVENOUS | Status: DC | PRN

## 2019-04-06 MED ORDER — METHYLERGONOVINE MALEATE 0.2 MG PO TABS
0.2000 mg | ORAL_TABLET | Freq: Four times a day (QID) | ORAL | Status: DC
  Administered 2019-04-06: 0.2 mg via ORAL
  Filled 2019-04-06: qty 1

## 2019-04-06 MED ORDER — MAGNESIUM SULFATE BOLUS VIA INFUSION
4.0000 g | Freq: Once | INTRAVENOUS | Status: AC
  Administered 2019-04-06: 04:00:00 4 g via INTRAVENOUS
  Filled 2019-04-06: qty 500

## 2019-04-06 MED ORDER — LACTATED RINGERS IV SOLN
INTRAVENOUS | Status: DC
  Administered 2019-04-06 (×3): via INTRAVENOUS

## 2019-04-06 MED ORDER — LABETALOL HCL 5 MG/ML IV SOLN: 80.0000 mg | INTRAVENOUS | Status: DC | PRN

## 2019-04-06 NOTE — Lactation Note (Signed)
This note was copied from a baby's chart. Lactation Consultation Note  Patient Name: Deborah Larsen [id number] Date: 04/06/2019    RN had just recently tried to assist with latching, but infant did not show any interest. Mom was interested in me doing hand expression with her again. Only a very small amount of EBM was obtained, which I finger-fed to infant.   Mom was amenable to using a bottle to get infant fed. Paced bottle-feeding was taught using the yellow Similac slow-flow nipple. He bottle fed well with relative ease for his 1st bottle feeding. I provided Mom with more yellow slow-flow nipples & a chart that shows how much infants should be drinking via bottle.   I encouraged Mom to feed "Deborah Larsen" as often as he wants, but try not to let more than 3 hrs to go between feedings. I encouraged Mom to pump whenever Deborah Larsen receives formula.   Note: a couple of times today during my consults, I would observe Deborah Larsen to be swallowing as if he were spitting up, but no emesis was ever visually noted.   A slight divet was noted at the tip of infant's tongue during certain tongue movements with possibly a slightly higher palate.   Deborah Larsen [facility] 04/06/2019, 3:43 PM

## 2019-04-06 NOTE — Progress Notes (Addendum)
PPD #1 SVD   S:  Reports feeling "much better", denies visual changes, denies tinnitis             Tolerating po/ No nausea or vomiting             Bleeding is light, no clots             Pain controlled with acetaminophen and ibuprofen (OTC)             Up ad lib / ambulatory / voiding w/o difficulty  Newborn Breast   O:               VS: BP 124/82 (BP Location: Right Arm)   Pulse 87   Temp 97.8 F (36.6 C) (Oral)   Resp 18   Ht 5\' 2"  (1.575 m)   Wt 73.7 kg   LMP 07/07/2018   SpO2 100%   Breastfeeding Unknown   BMI 29.70 kg/m    LABS:              Recent Labs    04/05/19 0550 04/06/19 0257  WBC 8.6 17.3*  HGB 11.6* 9.6*  PLT 142* 154               Blood type: --/--/O POS, O POS Performed at [facility] [address] [place], [place] [address]  [phone]/04 0550)  Rubella: Equivocal (04/21 0000)                     I&O: Intake/Output      10/04 0701 - 10/05 0700 10/05 0701 - 10/06 0700   P.O. 600    I.V. (mL/kg) 225 (3.1)    Other 50    IV Piggyback 0    Total Intake(mL/kg) 875 (11.9)    Urine (mL/kg/hr) 230 (0.1) 250 (1.9)   Stool 0    Blood 1038    Total Output 1268 250   Net -393 -250        Urine Occurrence 6 x    Stool Occurrence 1 x                  Physical Exam:             Alert and oriented X3  Abdomen: soft, non-tender, non-distended              Fundus: firm, non-tender, midline @ U+1  Perineum: edematous  Lochia: appropriate  Extremities: +1 non-pitting edema, no calf pain or tenderness    A:  PPD # 1 S/P PPH     -fundus firm, bleeding stable Pre-eclampsia     -MgSO4 therapy x24 hrs     -Normotensive     -Negative for neuro symptoms Rubella Equivocal   P:  Routine post partum orders Plan to D/C MgSO4 @ 0400 04/07/19 Call for SBP >160/DBP>110 D/C Methergine series Offer Rubella vaccine prior to discharge    Plan reviewed w/ Dr. Mancel Bale.   Burman Foster, MSN, CNM 04/06/2019 9:00 AM

## 2019-04-06 NOTE — Lactation Note (Signed)
This note was copied from a baby's chart. Lactation Consultation Note Baby 63 hrs old. Not feeding, fussy. RN asked for latching assistance. Mom holding baby STS. Praised mom.  Placed baby on mom's breast baby wouldn't suckle just cry. Baby has molding to head. Encouraged mom no to hold on the head but at base of neck.  Hand expressed few drops of colostrum. Mom feeling gushing of blood, light headed, hot, ringing in her Rt. Ear. Called RN. Mom pale. Laid HOB back. Wet wash cloth applied to forehead. RN to assess mom. Mom feeling bad. RN notified Dr.  Dr. Into assess mom. Code Hemorrhage called. [place] notified charge RN. LC will f/u later.   Patient Name: Deborah Larsen [id number] Date: 04/06/2019 Reason for consult: Follow-up assessment;Primapara;Early term 37-38.6wks   Maternal Data Has patient been taught Hand Expression?: Yes Does the patient have breastfeeding experience prior to this delivery?: No  Feeding    LATCH Score Latch: Too sleepy or reluctant, no latch achieved, no sucking elicited.  Audible Swallowing: None  Type of Nipple: Everted at rest and after stimulation  Comfort (Breast/Nipple): Soft / non-tender  Hold (Positioning): Full assist, staff holds infant at breast  LATCH Score: 4  Interventions Interventions: Breast feeding basics reviewed;Adjust position;Assisted with latch;Support pillows;Skin to skin;Position options;Breast massage;Expressed milk;Hand express;Breast compression  Lactation Tools Discussed/Used     Consult Status Consult Status: Follow-up Date: 04/06/19 Follow-up type: In-patient    Elorah Dewing, [place]ta [place] 04/06/2019, 2:58 AM

## 2019-04-06 NOTE — Lactation Note (Addendum)
This note was copied from a baby's chart. Lactation Consultation Note  Patient Name: Boy Gaylene Moylan [id number] Date: 04/06/2019 Reason for consult: Follow-up assessment  "Pamella Pert" is 67 hrs old. Mom is worried that he is not eating & is thinking about giving formula. I observed no feeding cues from [place]. He is currently in a quiet awake phase, content. I put him skin-to-skin with Mom & he is not rooting, just laying peacefully on her chest.   Hand expression was done with Mom & the resulting amount was spoon-fed & finger-fed to infant. He tolerated it well, but did not show any additional interest in feeding.   Mom will call me when infant shows feeding cues.   Mom was observed to have been using size 24 flanges with pumping. I replaced them with size 27 flanges.   Mom had a PPH of over 1000 mL. Matthias Hughs [facility] 04/06/2019, 11:13 AM

## 2019-04-06 NOTE — Progress Notes (Signed)
Got pt up to walk to bathroom and got her cleaned up. Pt tolerated well, but stated she wanted to keep foley catheter in for now because she was still feeling a little too weak to walk on her own to the bathroom.

## 2019-04-06 NOTE — Lactation Note (Addendum)
This note was copied from a baby's chart. Lactation Consultation Note  Patient Name: Deborah Larsen [id number] Date: 04/06/2019 Reason for consult: Follow-up assessment  Infant placed on Mom's chest after diaper change, but was not interested in feeding & fell asleep. Dad is now doing skin-to-skin with infant.   I observed Mom pumping with the size 27 flanges, which she says are more comfortable. Mom pumped less than 0.5 mL. Infant's last urine (which was his first since birth) looked slightly concentrated. Mom was interested in providing formula. Infant was finger-fed with ease.   Mom does not have [place] & does not yet have a pump at home. I informed her about rental from our gift shop. I washed Mom's pump parts for her & set them in bin to air dry.   Matthias Hughs [facility] 04/06/2019, 12:10 PM

## 2019-04-06 NOTE — Progress Notes (Signed)
Called Burman Foster, CNM about catheter removal. Received verbal order to leave foley in until at least 4 pm. If pt tolerates ambulation well without getting dizzy, foley can be d/c'd at 4 pm. If she does not tolerate it well, foley will need to be kept in until tomorrow morning at 4 am.

## 2019-04-06 NOTE — Progress Notes (Addendum)
In room to assess patient for first 4-hour check.  Patient had not been up to void, despite previous education to void every 1-2 hours to help avoid bleeding, passing clots.  Got patient up to void; when she stood up she felt a "large  Gush", but was able to walk to toilet.  Patient able to void; while patient in bathroom, nurse cleaned up bed.  Patient called out that she was suddenly dizzy; when nurse entered bathroom, patient passed two very large clots into toilet and felt better.  While helping patient with pericare, patient suddenly got dizzy again.  Nurse called for help; 7 nurses/techs responded and we were able to transport her, via stedy, safely to bed. VS WNL. [facility] notified; methergine series ordered.  Nurse gave 0.2 mg methergine PO to patient at 0200. Within minutes, patient called out saying she felt hot and dizzy.  After nurse (plus two other nurse) assessed patient, this nurse called [facility] again, who came to follow up with patient.

## 2019-04-06 NOTE — Progress Notes (Signed)
Subjective: Postpartum Day # 1 : S/P NSVD due to spontaneous labor. Patient up ad lib, denies syncope or dizziness. Reports consuming regular diet without issues and denies N/V. Patient reports 0 bowel movement + passing flatus.  Denies issues with urination, but RN reported pt did not want to get up to use the bathroom so it had been a lapse in time, but RN called and reported pt got out of bed, and there was blood noted on the bed and pt passed a clot in the toilet  Which weight 152mls and reports bleeding in bed was 473mls which puts her at 576mls loss now and is "now lighter." The uterus remained first. RN instructed to start methergine PO series. Total blood loss now combined with after delivery and late PP is 1032mls, pt dx with PP hemorrhage. Patient is breastfeeding and reports going well.  Desires unknwon for postpartum contraception.  Pain is being appropriately managed with use of po meds.   Perineal tear with right labial  Laceration, unrepaired and hemstatic Feeding:  breast Contraceptive plan:  undecided BB: Circ NO circ desired.   Objective: Vital signs in last 24 hours: Patient Vitals for the past 24 hrs:  BP Temp Temp src Pulse Resp SpO2 Height Weight  04/06/19 0204 113/73 98 F (36.7 C) Oral (!) 108 18 100 % - -  04/06/19 0052 125/86 98.7 F (37.1 C) Oral 79 18 100 % - -  04/05/19 2130 131/90 98.7 F (37.1 C) Oral 74 18 100 % - -  04/05/19 2030 (!) 143/91 98.3 F (36.8 C) - 62 18 100 % - -  04/05/19 1916 121/82 - - 79 - - - -  04/05/19 1901 116/78 98.5 F (36.9 C) Oral 92 - - - -  04/05/19 1848 126/77 - - 84 - - - -  04/05/19 1846 (!) 113/102 - - 97 - - - -  04/05/19 1831 112/88 - - (!) 127 - - - -  04/05/19 1817 112/81 - - (!) 103 - - - -  04/05/19 1650 (!) 156/85 98 F (36.7 C) Oral 92 19 - - -  04/05/19 1446 (!) 147/83 99.1 F (37.3 C) - 74 17 - - -  04/05/19 1335 (!) 131/91 - - 79 16 - - -  04/05/19 1300 (!) 153/95 - - 71 19 - - -  04/05/19 1210 (!) 146/94 - -  68 - - - -  04/05/19 1150 (!) 155/97 - - 65 - - - -  04/05/19 1043 139/90 - - 66 - - - -  04/05/19 1035 (!) 138/91 - - 84 18 - - -  04/05/19 0948 127/84 - - 63 17 - - -  04/05/19 0941 (!) 148/90 98.7 F (37.1 C) Oral 74 17 - - -  04/05/19 0802 119/87 - - 72 - - - -  04/05/19 0705 136/88 - - 60 - - - -  04/05/19 9326 (!) 144/81 98.4 F (36.9 C) Oral (!) 57 17 - - -  04/05/19 0510 129/88 - - 68 - 99 % - -  04/05/19 0449 (!) 143/89 98.7 F (37.1 C) Oral 87 18 100 % 5\' 2"  (1.575 m) 73.7 kg     Physical Exam:  General: alert, cooperative, appears stated age and pale Mood/Affect: Fatigued, flat Lungs: clear to auscultation, no wheezes, rales or rhonchi, symmetric air entry.  Heart: normal rate, regular rhythm, normal S1, S2, no murmurs, rubs, clicks or gallops. Breast: breasts appear normal, no suspicious masses, no  skin or nipple changes or axillary nodes. Abdomen:  + bowel sounds, soft, non-tender GU: perineum approximate, healing well. No signs of external hematomas.  Uterine Fundus: Boggy +4 deviated to left Lochia: inappropriate, excessive, with large clots Skin: cool and calmy DVT Evaluation: No evidence of DVT seen on physical exam. Negative Homan's sign. No cords or calf tenderness. No significant calf/ankle edema. Brisk Patellar DTRs, no clonus.   CBC Latest Ref Rng & Units 04/05/2019  WBC 4.0 - 10.5 K/uL 8.6  Hemoglobin 12.0 - 15.0 g/dL 11.6(L)  Hematocrit 36.0 - 46.0 % 34.7(L)  Platelets 150 - 400 K/uL 142(L)    Results for orders placed or performed during the hospital encounter of 04/05/19 (from the past 24 hour(s))  Fern Test     Status: None   Collection Time: 04/05/19  5:16 AM  Result Value Ref Range   POCT Fern Test Positive = ruptured amniotic membanes   SARS Coronavirus 2 [facility] order, Performed in [facility] hospital lab) Nasopharyngeal Nasopharyngeal Swab     Status: None   Collection Time: 04/05/19  5:30 AM   Specimen: Nasopharyngeal Swab  Result  Value Ref Range   SARS Coronavirus 2 NEGATIVE NEGATIVE  CBC     Status: Abnormal   Collection Time: 04/05/19  5:50 AM  Result Value Ref Range   WBC 8.6 4.0 - 10.5 K/uL   RBC 4.07 3.87 - 5.11 MIL/uL   Hemoglobin 11.6 (L) 12.0 - 15.0 g/dL   HCT 31.5 (L) 40.0 - 86.7 %   MCV 85.3 80.0 - 100.0 fL   MCH 28.5 26.0 - 34.0 pg   MCHC 33.4 30.0 - 36.0 g/dL   RDW 61.9 50.9 - 32.6 %   Platelets 142 (L) 150 - 400 K/uL   nRBC 0.0 0.0 - 0.2 %  Type and screen  [facility]     Status: None   Collection Time: 04/05/19  5:50 AM  Result Value Ref Range   ABO/RH(D) O POS    Antibody Screen NEG    Sample Expiration      04/08/2019,2359 Performed at [facility], [address], [place], [place] [address]   RPR     Status: None   Collection Time: 04/05/19  5:50 AM  Result Value Ref Range   RPR Ser Ql NON REACTIVE NON REACTIVE  ABO/Rh     Status: None   Collection Time: 04/05/19  5:50 AM  Result Value Ref Range   ABO/RH(D)      O POS Performed at [facility], [address] [place], [place] [address]   Protein / creatinine ratio, urine     Status: Abnormal   Collection Time: 04/05/19  1:32 PM  Result Value Ref Range   Creatinine, Urine 144.63 mg/dL   Total Protein, Urine 350 mg/dL   Protein Creatinine Ratio 2.42 (H) 0.00 - 0.15 mg/mg[Cre]  Comprehensive metabolic panel     Status: Abnormal   Collection Time: 04/05/19  5:21 PM  Result Value Ref Range   Sodium 136 135 - 145 mmol/L   Potassium 4.0 3.5 - 5.1 mmol/L   Chloride 106 98 - 111 mmol/L   CO2 16 (L) 22 - 32 mmol/L   Glucose, Bld 115 (H) 70 - 99 mg/dL   BUN 5 (L) 6 - 20 mg/dL   Creatinine, Ser 3.38 0.44 - 1.00 mg/dL   Calcium 8.5 (L) 8.9 - 10.3 mg/dL   Total Protein 6.3 (L) 6.5 - 8.1 g/dL  Albumin 2.8 (L) 3.5 - 5.0 g/dL   AST 36 15 - 41 U/L   ALT 18 0 - 44 U/L   Alkaline Phosphatase 128 (H) 38 - 126 U/L   Total Bilirubin 1.1 0.3 - 1.2 mg/dL   GFR calc non Af Amer >60 >60 mL/min   GFR calc Af Amer  >60 >60 mL/min   Anion gap 14 5 - 15  Uric acid     Status: None   Collection Time: 04/05/19  5:21 PM  Result Value Ref Range   Uric Acid, Serum 6.4 2.5 - 7.1 mg/dL  Lactate dehydrogenase     Status: Abnormal   Collection Time: 04/05/19  5:21 PM  Result Value Ref Range   LDH 209 (H) 98 - 192 U/L  Protein / creatinine ratio, urine     Status: Abnormal   Collection Time: 04/05/19  6:10 PM  Result Value Ref Range   Creatinine, Urine 107.58 mg/dL   Total Protein, Urine 400 mg/dL   Protein Creatinine Ratio 3.72 (H) 0.00 - 0.15 mg/mg[Cre]     CBG (last 3)  No results for input(s): GLUCAP in the last 72 hours.   I/O last 3 completed shifts: In: -  Out: 503 [Urine:25; Blood:478]   Assessment Postpartum Day # 1 : S/P NSVD due to spontaneous labor. Was firm @+1 involution @ 0230, per RN, see notes below for further events. breastfeeding. Hemodynamically stable. Delay PP hemorrhage, with total EBL of from delivery and PP @ 0230. Pt stable, had not urinated in a while and got up with dizziness, RN supported her back to bed. Uterus stable now and methergine series started. Pt BP stable 113/71 now, denies HA, RUQ pain or vision changes. Elevated BP of 140/90s during natural labor, labs resulted all unremarkable except PCR clean catch was 2.4 then I&O was 3.7. Pt exam and   Plan: @ 0230 Continue other mgmt as ordered Delayed PP Hemorrhage: strated PO methergine started.  VTE prophylactics: Early ambulated as tolerates.  Pain control: Motrin/Tylenol PRN Breastfeeding and Lactation consult    Addendum 2:38 AM RN called and said pt was seeing floater and feeling dizzy.   I went to the pts room to assess. Pt appeared pale, uterus was boggy +4 deviated to the left. I called a PPH. Cytotec , Hemabate, and TXA were added to her po methergine, foley was placed, firm uterine massage was performed, I swept the vagina and entrance of the cervic after giving of fentanyl The team  arrived, drew labs, I informed lab to draw her morning Preeclampsia labs as well. 1L LR was hung, pt appeared to pink up. BP @ that time was normotensive, pt endorsed seeing floater and having tinnitus in her right ear and right foot numbness.Pt complained about foot going numb while she was pushing on her right side during labor and the feeling has not come back yet. Pt denies HA, RUQ pain. Pt does endorses vision changes and on PE now has brisk reflex, no clonus. Pt denies cp or sob. Lungs CTA Bi-lat. Vaginal edema noted, but no lower extremely edema. On PE her face was broken out into petechiae, pt pushed hard for 2 hours during delivery. Subconjunctival hemorrhages were on in bilateral eyes.   After meds given pt BP was 140/100s.   Blood loss: 159ml+306mls+235mls+185mls+91mls+= total QBL PP of 944; Total from birth and PP is   Dr Dion Body was consulted and aware of pt progress and plan of care, discussed  also preeclampsia and starting magnesium. Pt denies HA, or RUQ pain, but endorses seeing spots and Patellar DTR went from +2 to brisk PP. Taking into consideration the PCR being 3.7, BP elevated during labor even with natural labor, [facility] and treat for PP preeclampsia with mag. Will start 4gm loading dose followed by 2gm/hr.   Labs @ 0330:   CBC: hgb drop from 11.6-9.6, platelets 154 up from 142. CMP: creatinine 0.98 up from 0.97, AST 59 up from 36, ALT 22 up from 18 LDH: 217 up from 209 Uric acid: 6.7 up from 6.4   I talked with the patient about preeclampsia and my concerns, for seizures, pt aware and questions were answer, pt consent to transfer and treatment. Pt now stable in bed, feeling nausea. Husband and baby at bedside getting ready for transfer.   Plan @ 0345 Preeclampsia with severe features: Mag 4gm bolus & 2gm/hr maintinence, [facility], Q4H neuro checks, labetalol protocol PRN for severe range Bps, strict IO. Fluids  maxed to 125mls. PP Hemorrhage: Continue po methergine series. Monitor VS.   Hendrix Console NP-C, CNM 04/06/2019, 2:10 AM  Dr. Su Hiltoberts and Maureen RalphsVivian CNM to be updated on patient status when they assume care at 0700.

## 2019-04-06 NOTE — Progress Notes (Signed)
Patient called out at 0200 complaining dizziness, ringing in ears, occasional floaters.  [facility] [place], CNM notified once again.  Jade coming up to assess

## 2019-04-07 DIAGNOSIS — O9081 Anemia of the puerperium: Secondary | ICD-10-CM | POA: Diagnosis not present

## 2019-04-07 LAB — CBC WITH DIFFERENTIAL/PLATELET
Abs Immature Granulocytes: 0.12 10*3/uL — ABNORMAL HIGH (ref 0.00–0.07)
Basophils Absolute: 0 10*3/uL (ref 0.0–0.1)
Basophils Relative: 0 %
Eosinophils Absolute: 0.1 10*3/uL (ref 0.0–0.5)
Eosinophils Relative: 1 %
HCT: 17.5 % — ABNORMAL LOW (ref 36.0–46.0)
Hemoglobin: 5.9 g/dL — CL (ref 12.0–15.0)
Immature Granulocytes: 1 %
Lymphocytes Relative: 33 %
Lymphs Abs: 3.4 10*3/uL (ref 0.7–4.0)
MCH: 28.8 pg (ref 26.0–34.0)
MCHC: 33.7 g/dL (ref 30.0–36.0)
MCV: 85.4 fL (ref 80.0–100.0)
Monocytes Absolute: 0.6 10*3/uL (ref 0.1–1.0)
Monocytes Relative: 6 %
Neutro Abs: 6 10*3/uL (ref 1.7–7.7)
Neutrophils Relative %: 59 %
Platelets: 124 10*3/uL — ABNORMAL LOW (ref 150–400)
RBC: 2.05 MIL/uL — ABNORMAL LOW (ref 3.87–5.11)
RDW: 13.9 % (ref 11.5–15.5)
WBC: 10.2 10*3/uL (ref 4.0–10.5)
nRBC: 0 % (ref 0.0–0.2)

## 2019-04-07 LAB — CBC
HCT: 26.3 % — ABNORMAL LOW (ref 36.0–46.0)
Hemoglobin: 9.2 g/dL — ABNORMAL LOW (ref 12.0–15.0)
MCH: 29.8 pg (ref 26.0–34.0)
MCHC: 35 g/dL (ref 30.0–36.0)
MCV: 85.1 fL (ref 80.0–100.0)
Platelets: 122 10*3/uL — ABNORMAL LOW (ref 150–400)
RBC: 3.09 MIL/uL — ABNORMAL LOW (ref 3.87–5.11)
RDW: 13.6 % (ref 11.5–15.5)
WBC: 9.2 10*3/uL (ref 4.0–10.5)
nRBC: 0.2 % (ref 0.0–0.2)

## 2019-04-07 LAB — COMPREHENSIVE METABOLIC PANEL
ALT: 28 U/L (ref 0–44)
AST: 62 U/L — ABNORMAL HIGH (ref 15–41)
Albumin: 2.1 g/dL — ABNORMAL LOW (ref 3.5–5.0)
Alkaline Phosphatase: 84 U/L (ref 38–126)
Anion gap: 8 (ref 5–15)
BUN: <5 mg/dL — ABNORMAL LOW (ref 6–20)
CO2: 25 mmol/L (ref 22–32)
Calcium: 6.7 mg/dL — ABNORMAL LOW (ref 8.9–10.3)
Chloride: 104 mmol/L (ref 98–111)
Creatinine, Ser: 0.74 mg/dL (ref 0.44–1.00)
GFR calc Af Amer: >60 mL/min (ref >60)
GFR calc non Af Amer: >60 mL/min (ref >60)
Glucose, Bld: 82 mg/dL (ref 70–99)
Potassium: 3.6 mmol/L (ref 3.5–5.1)
Sodium: 137 mmol/L (ref 135–145)
Total Bilirubin: 0.4 mg/dL (ref 0.3–1.2)
Total Protein: 4.8 g/dL — ABNORMAL LOW (ref 6.5–8.1)

## 2019-04-07 LAB — PREPARE RBC (CROSSMATCH)

## 2019-04-07 MED ORDER — SODIUM CHLORIDE 0.9% IV SOLUTION
Freq: Once | INTRAVENOUS | Status: AC
  Administered 2019-04-07: 12:00:00 via INTRAVENOUS

## 2019-04-07 NOTE — Progress Notes (Signed)
Per blood bank: please repeat Hgb after the 2nd unit PRBC to verify if patient needs the 3rd and 4th units. Burman Foster CNM made aware and will repeat CBC 4 hrs after 2nd unit complete.

## 2019-04-07 NOTE — Lactation Note (Signed)
This note was copied from a baby's chart. Lactation Consultation Note  Patient Name: Deborah Larsen CLEXN'T Date: 04/07/2019 Reason for consult: Follow-up assessment;Mother's request  2154 - 2222 - Deborah Larsen paged for assistance. Her son took 40 ml around 7 pm. She wanted to attempt to put baby to breast.   Deborah Larsen is able to hand express her colostrum. She expressed some out prior to latching baby.  I attempted to wake baby up. I changed a dirty diaper, and he roused. We then placed him in football hold on Deborah Larsen's left breast, and he opened his mouth around her nipple, but he would not suckle. Occasionally he gave a brief suck. He fell asleep at the breast.  Following this attempt, I help Deborah Larsen pump. She is beginning to see her milk express more easily.  Deborah Larsen would like to try to breast feed more tonight. I encouraged her to try again in an hour and to provide any of her EBM as supplement. I also provided her with the supplementation guidelines and and reviewed how much to offer on days 2 and 3.  PLAN: Offer the breast first with feeding cues 8-12 times/day. Pump after. Feed baby and EBM as supplementation or formula if EBM is not available. Use paced bottle feeding technique as shown in an earlier consult today.   Deborah Larsen has medicaid, but she is not enrolled in [facility]. She does not have a breast pump at home.  Maternal Data Has patient been taught Hand Expression?: Yes Does the patient have breastfeeding experience prior to this delivery?: No  Feeding    LATCH Score Latch: Too sleepy or reluctant, no latch achieved, no sucking elicited.  Audible Swallowing: None  Type of Nipple: Everted at rest and after stimulation  Comfort (Breast/Nipple): Soft / non-tender  Hold (Positioning): No assistance needed to correctly position infant at breast.  LATCH Score: 6  Interventions Interventions: Breast feeding basics reviewed;Assisted with  latch;Skin to skin;Hand express;Breast compression;Support pillows;DEBP  Lactation Tools Discussed/Used Tools: Pump Breast pump type: Double-Electric Breast Pump WIC Program: (new enrollment?) Pump Review: Setup, frequency, and cleaning;Milk Storage Initiated by:: hl   Consult Status Consult Status: Follow-up Date: 04/08/19 Follow-up type: In-patient    Deborah Larsen 04/07/2019, 10:33 PM

## 2019-04-07 NOTE — Lactation Note (Signed)
This note was copied from a baby's chart. Lactation Consultation Note  Patient Name: Deborah Taneshia Larsen [id number] Date: 04/07/2019    Infant is 34 hr old. Mom is receiving a blood transfusion. Mom's Hgb this morning was 5.9.   Infant is cueing to feed. Infant attached to the breast with ease, however, there was no suckling observed. Mom is content to allow "[place]" to remain attached to her breast. Mom says this is infant's 2nd "latch" at the breast.   Mom's breasts do not palpate any fuller than yesterday.   Matthias Hughs [facility] 04/07/2019, 10:48 AM

## 2019-04-07 NOTE — Progress Notes (Signed)
CRITICAL VALUE ALERT  Critical Value: Hgb. 5.9   Date & Time Notied:  10/6 @ 7915  Provider Notified: Burman Foster CNM @ [phone]  Orders Received/Actions taken: CNM to see patient and discuss blood administration

## 2019-04-07 NOTE — Progress Notes (Signed)
Informed consent for blood transfusion provided, risks and benefits discussed. Lorn Junes, [place] Interpreter present. Pt and S/O, Juan, agree to procedure. Consent signed. Dr. Charlesetta Garibaldi aware of plan.   Burman Foster, MSN, CNM 04/07/2019 10:06 AM

## 2019-04-07 NOTE — Progress Notes (Signed)
PPD# 2 SVD  Information for the patient's newborn:  Deborah Larsen, Deborah Larsen [[id number]]  female    12 Name: Deborah Larsen Circumcision: Declines  RN notified CNM of critical Hgb value of 5.9  S:   Reports feeling tired, somewhat lightheaded Tolerating PO fluid and solids No nausea or vomiting Bleeding is light, no clots Pain controlled with PO meds Up ad lib / ambulatory / voiding w/o difficulty Bottle and Breast    O:   VS: BP 135/78 (BP Location: Right Arm)   Pulse (!) 107   Temp 98 F (36.7 C) (Oral)   Resp 18   Ht 5\' 2"  (1.575 m)   Wt 73.7 kg   LMP 07/07/2018   SpO2 99%   Breastfeeding Unknown   BMI 29.70 kg/m   LABS:  Recent Labs    04/06/19 0257 04/07/19 0710  WBC 17.3* 10.2  HGB 9.6* 5.9*  PLT 154 124*   Blood type: --/--/O POS, O POS Performed at [facility], [address], [place], [place] [address]  [phone] 0550) Rubella: Equivocal (04/21 0000)                      I&O: Intake/Output      10/05 0701 - 10/06 0700 10/06 0701 - 10/07 0700   P.O. 600 180   I.V. (mL/kg) 1195.6 (16.2)    Other     IV Piggyback     Total Intake(mL/kg) 1795.6 (24.4) 180 (2.4)   Urine (mL/kg/hr) 6150 (3.5) 700 (4)   Stool     Blood     Total Output 6150 700   Net -4354.4 -520          Physical Exam: Const: Alert and oriented X3 Lungs: Clear and unlabored Heart: regular rate and rhythm / no mumurs Abdomen: soft, non-tender, non-distended  Fundus: firm, non-tender, below umbilicus Perineum: edematous Lochia: scant Extremities: 1+ pitting edema, no calf pain or tenderness    A:  PPD # 2 Symptomatic anemia   S/P MgSO4 therapy     -normotensive  P:  Transfuse PRBCs x4 units today CBC in AM Continue PP orders    Plan reviewed w/ Dr. Jethro Bastos, MSN, CNM 04/07/2019, 9:23 AM

## 2019-04-07 NOTE — Discharge Summary (Signed)
SVD OB Discharge Summary     Patient Name: Deborah Larsen DOB: 1995/07/18 MRN: [id number]  Date of admission: 04/05/2019 Delivering MD: Dale [place]  Date of delivery: 04/05/2019 Type of delivery: SVD  Newborn Data: Sex: Baby Female Circumcision: No circ desired Live born female  Birth Weight: 7 lb 9 oz (3430 g) APGAR: 7, 9  Newborn Delivery   Birth date/time: 04/05/2019 17:41:00 Delivery type: Vaginal, Spontaneous     Feeding: breast and bottle Infant being discharge to home with mother in stable condition.   Admitting diagnosis: 38 wks water broke Intrauterine pregnancy: [redacted]w[redacted]d     Secondary diagnosis:  Principal Problem:   Severe pre-eclampsia, with delivery, with current postpartum complication Active Problems:   PROM (premature rupture of membranes)   Postpartum hemorrhage   Delivery normal   Postpartum anemia                                Complications: None                                                              Intrapartum Procedures: spontaneous vaginal delivery and development of preeclampsia wihtout severe features during labor Postpartum Procedures: transfusion x2 units RBC and Development of preeclampsia with severe features and delayed PPH Complications-Operative and Postpartum: perineal tear and right labial tear degree perineal laceration Augmentation: None   History of Present Illness: Deborah Larsen is a 23 y.o. female, G1P1001, who presents at [redacted]w[redacted]d weeks gestation. The patient has been followed at  [facility]  Her pregnancy has been complicated by:  Patient Active Problem List   Diagnosis Date Noted  . Postpartum anemia 04/07/2019  . Postpartum hemorrhage 04/06/2019  . Delivery normal 04/06/2019  . Severe pre-eclampsia, with delivery, with current postpartum complication 04/06/2019  . PROM (premature rupture of membranes) 04/05/2019    Hospital course:  Onset of Labor With Vaginal  Delivery     23 y.o. yo G1P1001 at [redacted]w[redacted]d was admitted in Latent Labor on 04/05/2019. Patient had an uncomplicated labor course as follows:  Membrane Rupture Time/Date: 3:00 AM ,04/05/2019   Intrapartum Procedures: Episiotomy: None [1]                                         Lacerations:  None [1]  Patient had a delivery of a Viable infant. 04/05/2019  Information for the patient's newborn:  Deborah, Larsen [[id number]]  Delivery Method: Vaginal, Spontaneous(Filed from Delivery Summary)     Pateint had a complicated postpartum course, read below for details.  She is ambulating, tolerating a regular diet, passing flatus, and urinating well. Patient is discharged home in stable condition on 04/08/19.  Postpartum Day # 3 : S/P NSVD due to spontaneous labor and delivery. Patient up ad lib, denies syncope or dizziness. Reports consuming regular diet without issues and denies N/V. Patient reports 0 bowel movement + passing flatus.  Denies issues with urination and reports bleeding is "light."  Patient is breast and bottle feeding and reports going well.  Desires undecided for postpartum contraception.  Pain is being appropriately  managed with use of po meds. Pt developed preeclampsia during labor then preeclampsia with severe features PP and was placed on 24 hours of magnesium. Current pt denies HA, RUQ pain or vision changes. Pt also had a 6 hours delayed PPH with QBL of 1454mls, pt hgb dropped from admission of 11.6 to postpartum 9.6 to post hemorrhage the next day of 5.7 with asymptomatic anemia, was transfused x2 units RBC, post transfusion hgb was 9.2, pt fatigued but no longer having s/sx of anemia. Pt now stable and requesting to go home.   Physical exam  Vitals:   04/07/19 2127 04/07/19 2315 04/08/19 0458 04/08/19 0600  BP: (!) 142/75 128/76 (!) 150/84 123/80  Pulse: 71 96 75   Resp: 18 16 18    Temp: 98.6 F (37 C) 98.7 F (37.1 C) 98.5 F (36.9 C)   TempSrc: Oral Oral Oral   SpO2:  100% 100% 99%   Weight:      Height:       General: alert, cooperative and no distress Lochia: appropriate Uterine Fundus: firm Perineum: Approximate , no hematomas.  DVT Evaluation: No evidence of DVT seen on physical exam. Negative Homan's sign. No cords or calf tenderness. 1+ pitting edema in LE, 2+ DRT, no clonus  Labs: Lab Results  Component Value Date   WBC 9.2 04/07/2019   HGB 9.2 (L) 04/07/2019   HCT 26.3 (L) 04/07/2019   MCV 85.1 04/07/2019   PLT 122 (L) 04/07/2019   CMP Latest Ref Rng & Units 04/07/2019  Glucose 70 - 99 mg/dL 82  BUN 6 - 20 mg/dL <5(L)  Creatinine 0.44 - 1.00 mg/dL 0.74  Sodium 135 - 145 mmol/L 137  Potassium 3.5 - 5.1 mmol/L 3.6  Chloride 98 - 111 mmol/L 104  CO2 22 - 32 mmol/L 25  Calcium 8.9 - 10.3 mg/dL 6.7(L)  Total Protein 6.5 - 8.1 g/dL 4.8(L)  Total Bilirubin 0.3 - 1.2 mg/dL 0.4  Alkaline Phos 38 - 126 U/L 84  AST 15 - 41 U/L 62(H)  ALT 0 - 44 U/L 28    Date of discharge: 04/08/2019 Discharge Diagnoses: Term Pregnancy-delivered, Preelampsia and [facility] Discharge instruction: per After Visit Summary and "Baby and Me Booklet".  After visit meds:   Activity:           unrestricted and pelvic rest Advance as tolerated. Pelvic rest for 6 weeks.  Diet:                routine Medications: PNV, Colace, Iron and Tylenol Postpartum contraception: Undecided Condition:  Pt discharge to home with baby in stable Anemia: Iron Preeclampsia: f/u with [facility] in one week for BP check, monitor BP at home if >150/90 report, report s/sx of preeclampsia.  Rubella Non-Immune: Vaccine at [facility] PP  Meds: Allergies as of 04/08/2019   No Known Allergies     Medication List    TAKE these medications   acetaminophen 325 MG tablet Commonly known as: Tylenol Take 2 tablets (650 mg total) by mouth every 4 (four) hours as needed (for pain scale < 4).   ferrous sulfate 325 (65 FE) MG EC tablet Take 1 tablet (325 mg total) by mouth 2 (two) times daily.    prenatal multivitamin Tabs tablet Take 1 tablet by mouth daily at 12 noon.       Discharge Follow Up:  Follow-up [facility] Follow up.   Specialty: Obstetrics and Gynecology Why: Please make a 1 week  BP appintment check, and 6 week PPV Contact information: [address] [place] [phone]           [place]oulderJade Emmi Wertheim, NP-C, CNM 04/08/2019, 7:16 AM  Dale DurhamJade Marguerita Stapp, FNP

## 2019-04-07 NOTE — Lactation Note (Signed)
This note was copied from a baby's chart. Lactation Consultation Note  Patient Name: Deborah Larsen [id number] Date: 04/07/2019    Mom says feedings are going well. Infant recently latched to the breast for the 1st time. Mom to call for me to see infant feed at the breast.   Mom commented that it seems that infant feeds quickly from the yellow slow flow nipple, which was shared with Jeannene Patella, RN. I made Mom aware that she can feed infant until content and not just up to 10 mL.   Matthias Hughs [facility] 04/07/2019, 9:32 AM

## 2019-04-07 NOTE — Lactation Note (Signed)
This note was copied from a baby's chart. Lactation Consultation Note  Patient Name: Deborah Larsen [id number] Date: 04/07/2019   Mom allowed [place] to stay attached at breast for about 30 minutes (no suckling). Paced bottle-feeding was taught to Mom. Infant did well with the yellow slow-flow nipple & pacing infant q 6-8 swallows. Mom was shown how to feed infant in a side-lying inclined position & how to crimp bottle teat to allow pacing. Infant demonstrated excellent suction, the sound of swallows was comfortable, & formula was not lost from the sides of the mouth. Mom translated my tips so that Dad would understand, also.   Mom has not pumped since yesterday at 1500. Mom has had little uninterrupted sleep since early Sun morning. I encouraged Mom to try and sleep & then pump later, as she feels she is able. Mom expresses a desire to pump later on.    Matthias Hughs [facility] 04/07/2019, 11:33 AM

## 2019-04-08 MED ORDER — FERROUS SULFATE 325 (65 FE) MG PO TBEC: 325.0000 mg | DELAYED_RELEASE_TABLET | Freq: Two times a day (BID) | ORAL | 3 refills | Status: DC

## 2019-04-08 MED ORDER — ACETAMINOPHEN 325 MG PO TABS: 650.0000 mg | ORAL_TABLET | ORAL | 1 refills | Status: DC | PRN

## 2019-04-08 NOTE — Lactation Note (Addendum)
This note was copied from a baby's chart. Lactation Consultation Note  Patient Name: Deborah Larsen [id number] Date: 04/08/2019 Reason for consult: Follow-up assessment;Primapara;Difficult latch;Early term 37-38.6wks;1st time breastfeeding;Other (Comment)(PPH)  F/U visit with P1 mom who delivered @ 38.6 wks, baby is now 58 hours old. Mom states baby breastfed well throughout the night so she didn't pump. Mom denies any pain with latching or pumping. Mom states she wants to breastfeed and also supplement with formula. Mom is not enrolled in [facility].  Lab at bedside to draw bilirubin but encouraged mom to latch prior to heelstick. Mom states she fed baby @ 0730 in which baby breastfed well and she supplemented with 34ml formula. Infant sleeping soundly in mother's arms. Offered to assist mom to latch baby and mom agrees. Reviewed ways to wake a sleepy baby including removing clothing, STS, and changing diaper. Infant noted to have wet diaper. Infant stirring some but still remains sleepy. Minimal rooting at the breast noted after mom performed hand expression with small drop on nipple. Infant latched very briefly before falling asleep, despite heelstick and specimen collection. Encouraged mom to feed 8-12 times in 24 hours and with feeding cues. Recommended mom stimulate her breasts at least every 3 hours by baby or by pump. Mom states she doesn't have a breast pump at home. Reviewed use of manual pump previously provided. Reminded mom about pump disassembly and cleaning. Reviewed engorgement prevention and treatment techniques by removing the milk by baby preferably and if not by pump. Reviewed ways to soften breast including massage, pumping, and ice. Reminded mom about OP lactation services, lactation phone number, and support groups via [email or web address]   Maternal Data Has patient been taught Hand Expression?: Yes(mom demonstrated) Does the patient have breastfeeding experience prior to  this delivery?: No  Feeding    LATCH Score Latch: Too sleepy or reluctant, no latch achieved, no sucking elicited.  Audible Swallowing: None  Type of Nipple: Everted at rest and after stimulation  Comfort (Breast/Nipple): Soft / non-tender  Hold (Positioning): No assistance needed to correctly position infant at breast.  LATCH Score: 6  Interventions Interventions: Breast feeding basics reviewed;Assisted with latch;Skin to skin;Pre-pump if needed;Ice;Position options;Support pillows;Adjust position;Hand pump  Lactation Tools Discussed/Used WIC Program: No Pump Review: Setup, frequency, and cleaning   Consult Status Consult Status: Complete    Cranston Neighbor 04/08/2019, 9:40 AM

## 2019-04-09 LAB — TYPE AND SCREEN
ABO/RH(D): O POS
Antibody Screen: NEGATIVE
Unit division: 0
Unit division: 0
Unit division: 0
Unit division: 0
Unit division: 0
Unit division: 0

## 2019-04-09 LAB — BPAM RBC
Blood Product Expiration Date: 202010272359
Blood Product Expiration Date: 202010302359
Blood Product Expiration Date: 202011062359
Blood Product Expiration Date: 202011062359
Blood Product Expiration Date: 202011062359
Blood Product Expiration Date: 202011062359
ISSUE DATE / TIME: 202010050807
ISSUE DATE / TIME: 202010051406
ISSUE DATE / TIME: 202010061012
ISSUE DATE / TIME: 202010061216
Unit Type and Rh: 5100
Unit Type and Rh: 5100
Unit Type and Rh: 5100
Unit Type and Rh: 5100
Unit Type and Rh: 5100
Unit Type and Rh: 5100

## 2019-07-20 ENCOUNTER — Encounter (HOSPITAL_COMMUNITY): Payer: Self-pay | Admitting: Obstetrics and Gynecology

## 2020-03-24 LAB — OB RESULTS CONSOLE HIV ANTIBODY (ROUTINE TESTING): HIV: NONREACTIVE

## 2020-03-24 LAB — OB RESULTS CONSOLE RUBELLA ANTIBODY, IGM: Rubella: NON-IMMUNE/NOT IMMUNE

## 2020-03-24 LAB — OB RESULTS CONSOLE HEPATITIS B SURFACE ANTIGEN: Hepatitis B Surface Ag: NEGATIVE

## 2020-04-15 LAB — OB RESULTS CONSOLE GC/CHLAMYDIA
Chlamydia: NEGATIVE
Gonorrhea: NEGATIVE

## 2020-07-02 NOTE — L&D Delivery Note (Signed)
Delivery Note Labor onset: 09/27/2020  Labor Onset Time: 0700 Complete dilation at 9:49 PM  Onset of pushing at 0949 FHR second stage Cat 2 Analgesia/Anesthesia intrapartum: N/A  Guided pushing with maternal urge. Delivery of a viable female at 2153. Fetal head delivered in ROA position.  Nuchal cord: N/A.  Infant placed on maternal abd, dried, and tactile stim. Spontaneous cry.  Cord double clamped after pulsation ceased and cut by FOB.  3 RNspresent for birth.  Cord blood sample collected. Arterial cord blood sample collected: N/A  Placenta delivered Deborah Larsen, intact, with 3 VC.  Placenta to L&D. Uterine tone firm, bleeding minimal  1st degress, Left periurethral laceration identified.  Anesthesia: Lidocaine 1 % Repair: 3-0 vicryl, 4-0 vicryl QBL/EBL (mL): 200 Complications: N/A APGAR: APGAR (1 MIN): 8   APGAR (5 MINS): 9   APGAR (10 MINS):   Mom to postpartum.  Baby to Couplet care / Skin to Skin. Dr Connye Burkitt aware of pt status and POC.  Gerhard Munch Rosalind Guido MSN, CNM 09/28/2020, 1:10 AM

## 2020-09-16 LAB — OB RESULTS CONSOLE GBS: GBS: NEGATIVE

## 2020-09-27 ENCOUNTER — Encounter (HOSPITAL_COMMUNITY): Payer: Self-pay | Admitting: Obstetrics and Gynecology

## 2020-09-27 ENCOUNTER — Encounter (HOSPITAL_COMMUNITY): Payer: Self-pay | Admitting: Obstetrics & Gynecology

## 2020-09-27 ENCOUNTER — Inpatient Hospital Stay (HOSPITAL_COMMUNITY)
Admission: AD | Admit: 2020-09-27 | Discharge: 2020-09-29 | DRG: 806 | Disposition: A | Discharge status: Home / Self Care | Payer: Medicaid Other | Attending: Obstetrics and Gynecology | Admitting: Obstetrics and Gynecology

## 2020-09-27 ENCOUNTER — Inpatient Hospital Stay (EMERGENCY_DEPARTMENT_HOSPITAL)
Admission: AD | Admit: 2020-09-27 | Discharge: 2020-09-27 | Disposition: A | Discharge status: Home / Self Care | Payer: Medicaid Other | Source: Home / Self Care | Attending: Obstetrics & Gynecology | Admitting: Obstetrics & Gynecology

## 2020-09-27 ENCOUNTER — Other Ambulatory Visit: Payer: Self-pay

## 2020-09-27 DIAGNOSIS — D62 Acute posthemorrhagic anemia: Secondary | ICD-10-CM | POA: Diagnosis not present

## 2020-09-27 DIAGNOSIS — O9081 Anemia of the puerperium: Secondary | ICD-10-CM | POA: Diagnosis not present

## 2020-09-27 DIAGNOSIS — O471 False labor at or after 37 completed weeks of gestation: Secondary | ICD-10-CM

## 2020-09-27 DIAGNOSIS — Z3A37 37 weeks gestation of pregnancy: Secondary | ICD-10-CM | POA: Diagnosis not present

## 2020-09-27 DIAGNOSIS — Z20822 Contact with and (suspected) exposure to covid-19: Secondary | ICD-10-CM | POA: Diagnosis present

## 2020-09-27 DIAGNOSIS — O26893 Other specified pregnancy related conditions, third trimester: Secondary | ICD-10-CM | POA: Diagnosis present

## 2020-09-27 DIAGNOSIS — O479 False labor, unspecified: Secondary | ICD-10-CM

## 2020-09-27 LAB — RAPID HIV SCREEN (HIV 1/2 AB+AG)
HIV 1/2 Antibodies: NONREACTIVE
HIV-1 P24 Antigen - HIV24: NONREACTIVE

## 2020-09-27 LAB — CBC
HCT: 36.1 % (ref 36.0–46.0)
Hemoglobin: 11.9 g/dL — ABNORMAL LOW (ref 12.0–15.0)
MCH: 27 pg (ref 26.0–34.0)
MCHC: 33 g/dL (ref 30.0–36.0)
MCV: 82 fL (ref 80.0–100.0)
Platelets: 212 10*3/uL (ref 150–400)
RBC: 4.4 MIL/uL (ref 3.87–5.11)
RDW: 12.9 % (ref 11.5–15.5)
WBC: 12 10*3/uL — ABNORMAL HIGH (ref 4.0–10.5)
nRBC: 0 % (ref 0.0–0.2)

## 2020-09-27 LAB — RESP PANEL BY RT-PCR (FLU A&B, COVID) ARPGX2
Influenza A by PCR: NEGATIVE
Influenza B by PCR: NEGATIVE
SARS Coronavirus 2 by RT PCR: NEGATIVE

## 2020-09-27 LAB — TYPE AND SCREEN
ABO/RH(D): O POS
Antibody Screen: NEGATIVE

## 2020-09-27 MED ORDER — OXYCODONE-ACETAMINOPHEN 5-325 MG PO TABS
2.0000 | ORAL_TABLET | ORAL | Status: DC | PRN
Start: 2020-09-27 — End: 2020-09-27

## 2020-09-27 MED ORDER — LACTATED RINGERS IV SOLN: 500.0000 mL | INTRAVENOUS | Status: DC | PRN

## 2020-09-27 MED ORDER — FENTANYL-BUPIVACAINE-NACL 0.5-0.125-0.9 MG/250ML-% EP SOLN: 12.0000 mL/h | EPIDURAL | Status: DC | PRN

## 2020-09-27 MED ORDER — OXYTOCIN-SODIUM CHLORIDE 30-0.9 UT/500ML-% IV SOLN
2.5000 [IU]/h | INTRAVENOUS | Status: DC
  Administered 2020-09-27: 2.5 [IU]/h via INTRAVENOUS
  Filled 2020-09-27: qty 500

## 2020-09-27 MED ORDER — FENTANYL CITRATE (PF) 100 MCG/2ML IJ SOLN
50.0000 ug | INTRAMUSCULAR | Status: DC | PRN
  Administered 2020-09-27: 50 ug via INTRAVENOUS
  Administered 2020-09-27: 100 ug via INTRAVENOUS
  Filled 2020-09-27 (×2): qty 2

## 2020-09-27 MED ORDER — PHENYLEPHRINE 40 MCG/ML (10ML) SYRINGE FOR IV PUSH (FOR BLOOD PRESSURE SUPPORT): 80.0000 ug | PREFILLED_SYRINGE | INTRAVENOUS | Status: DC | PRN

## 2020-09-27 MED ORDER — DIPHENHYDRAMINE HCL 50 MG/ML IJ SOLN: 12.5000 mg | INTRAMUSCULAR | Status: DC | PRN

## 2020-09-27 MED ORDER — EPHEDRINE 5 MG/ML INJ: 10.0000 mg | INTRAVENOUS | Status: DC | PRN

## 2020-09-27 MED ORDER — SOD CITRATE-CITRIC ACID 500-334 MG/5ML PO SOLN: 30.0000 mL | ORAL | Status: DC | PRN

## 2020-09-27 MED ORDER — OXYTOCIN BOLUS FROM INFUSION
333.0000 mL | Freq: Once | INTRAVENOUS | Status: AC
  Administered 2020-09-27: 333 mL via INTRAVENOUS

## 2020-09-27 MED ORDER — LIDOCAINE HCL (PF) 1 % IJ SOLN
30.0000 mL | INTRAMUSCULAR | Status: AC | PRN
  Administered 2020-09-27: 30 mL via SUBCUTANEOUS
  Filled 2020-09-27: qty 30

## 2020-09-27 MED ORDER — ACETAMINOPHEN 325 MG PO TABS: 650.0000 mg | ORAL_TABLET | ORAL | Status: DC | PRN

## 2020-09-27 MED ORDER — ONDANSETRON HCL 4 MG/2ML IJ SOLN: 4.0000 mg | Freq: Four times a day (QID) | INTRAMUSCULAR | Status: DC | PRN

## 2020-09-27 MED ORDER — OXYCODONE-ACETAMINOPHEN 5-325 MG PO TABS: 1.0000 | ORAL_TABLET | ORAL | Status: DC | PRN

## 2020-09-27 MED ORDER — LACTATED RINGERS IV SOLN
INTRAVENOUS | Status: DC
  Administered 2020-09-27: 1000 mL via INTRAVENOUS

## 2020-09-27 MED ORDER — LACTATED RINGERS IV SOLN: 500.0000 mL | Freq: Once | INTRAVENOUS | Status: DC

## 2020-09-27 MED ORDER — TRANEXAMIC ACID-NACL 1000-0.7 MG/100ML-% IV SOLN
1000.0000 mg | Freq: Once | INTRAVENOUS | Status: DC | PRN
  Filled 2020-09-27: qty 100

## 2020-09-27 MED ORDER — LIDOCAINE HCL (PF) 1 % IJ SOLN: 30.0000 mL | Freq: Once | INTRAMUSCULAR | Status: DC

## 2020-09-27 NOTE — MAU Provider Note (Addendum)
S: Patient is here for RN labor evaluation. Strip, vital signs, & chart Reviewed   O:  Vitals:   09/27/20 1241 09/27/20 1408  BP: 105/69 112/69  Pulse: 91 76  Resp: 16   Temp: 98 F (36.7 C)   TempSrc: Oral   SpO2: 100%   Weight: 71.8 kg   Height: 5\' 2"  (1.575 m)    No results found for this or any previous visit (from the past 24 hour(s)).  Dilation: 3 Effacement (%): 80 Cervical Position: Middle Station: -3 Presentation: Vertex Exam by:: [email or web address], RN   FHR: 135 bpm, Mod Var, no Decels, 15x15 Accels UC: irregular   A: 1. False labor   2. [redacted] weeks gestation of pregnancy      P:  RN to discharge home in stable condition with return precautions & fetal kick counts  [employer] FNP 2:29 PM  Attestation of Attending Supervision of Advanced Practice Provider (PA/CNM/NP): Evaluation and management procedures were performed by the Advanced Practice Provider under my supervision and collaboration.  I have reviewed the Advanced Practice Provider's note and chart, and I agree with the management and plan. I have also made any necessary editorial changes.   Judeth Horn, DO Attending Obstetrician & Gynecologist, [facility], [facility] 09/28/2020 2:53 PM

## 2020-09-27 NOTE — MAU Note (Signed)
Pt standing at bedside 

## 2020-09-27 NOTE — Progress Notes (Signed)
Deborah Larsen is a 25 y.o. G2P1001 at [redacted]w[redacted]d admitted for Normal spontaneous labor.  Subjective:  Deborah Larsen is doing well with natural labor. Asking for AROM. R/B/A discussed. AROM complete. 9/100/+1. Imminent delivery.  Objective: BP 115/70   Pulse 88   Temp 98.3 F (36.8 C) (Oral)   Resp 17   SpO2 100%  No intake/output data recorded. No intake/output data recorded.  FHT:  FHR: 145 bpm, variability: moderate,  accelerations:  Present,  decelerations:  Absent UC:   irregular, every 2-4 minutes SVE:   Dilation: 8 Effacement (%): 90 Station: 0 Exam by:: DFM  Labs: Lab Results  Component Value Date   WBC 12.0 (H) 09/27/2020   HGB 11.9 (L) 09/27/2020   HCT 36.1 09/27/2020   MCV 82.0 09/27/2020   PLT 212 09/27/2020    Assessment / Plan: Spontaneous labor, progressing normally  Labor: Progressing normally Fetal Wellbeing:  Category I Pain Control:  Labor support without medications I/D:  GBS negative Anticipated MOD:  NSVD  Carollee Leitz MSN, CNM 09/27/2020, 9:40 PM

## 2020-09-27 NOTE — Discharge Instructions (Signed)
Fetal Movement Counts Patient Name: ________________________________________________ Patient Due Date: ____________________  What is a fetal movement count? A fetal movement count is the number of times that you feel your baby move during a certain amount of time. This may also be called a fetal kick count. A fetal movement count is recommended for every pregnant woman. You may be asked to start counting fetal movements as early as week 28 of your pregnancy. Pay attention to when your baby is most active. You may notice your baby's sleep and wake cycles. You may also notice things that make your baby move more. You should do a fetal movement count:  When your baby is normally most active.  At the same time each day. A good time to count movements is while you are resting, after having something to eat and drink. How do I count fetal movements? 1. Find a quiet, comfortable area. Sit, or lie down on your side. 2. Write down the date, the start time and stop time, and the number of movements that you felt between those two times. Take this information with you to your health care visits. 3. Write down your start time when you feel the first movement. 4. Count kicks, flutters, swishes, rolls, and jabs. You should feel at least 10 movements. 5. You may stop counting after you have felt 10 movements, or if you have been counting for 2 hours. Write down the stop time. 6. If you do not feel 10 movements in 2 hours, contact your health care provider for further instructions. Your health care provider may want to do additional tests to assess your baby's well-being. Contact a health care provider if:  You feel fewer than 10 movements in 2 hours.  Your baby is not moving like he or she usually does. Date: ____________ Start time: ____________ Stop time: ____________ Movements: ____________ Date: ____________ Start time: ____________ Stop time: ____________ Movements: ____________ Date: ____________  Start time: ____________ Stop time: ____________ Movements: ____________ Date: ____________ Start time: ____________ Stop time: ____________ Movements: ____________ Date: ____________ Start time: ____________ Stop time: ____________ Movements: ____________ Date: ____________ Start time: ____________ Stop time: ____________ Movements: ____________ Date: ____________ Start time: ____________ Stop time: ____________ Movements: ____________ Date: ____________ Start time: ____________ Stop time: ____________ Movements: ____________ Date: ____________ Start time: ____________ Stop time: ____________ Movements: ____________ This information is not intended to replace advice given to you by your health care provider. Make sure you discuss any questions you have with your health care provider. Document Revised: 02/05/2019 Document Reviewed: 02/05/2019 Elsevier Patient Education  2021 Elsevier Inc. Signs and Symptoms of Labor Labor is the body's natural process of moving the baby and the placenta out of the uterus. The process of labor usually starts when the baby is full-term, between 37 and 40 weeks of pregnancy. Signs and symptoms that you are close to going into labor As your body prepares for labor and the birth of your baby, you may notice the following symptoms in the weeks and days before true labor starts:  Passing a small amount of thick, bloody mucus from your vagina. This is called normal bloody show or losing your mucus plug. This may happen more than a week before labor begins, or right before labor begins, as the opening of the cervix starts to widen (dilate). For some women, the entire mucus plug passes at once. For others, pieces of the mucus plug may gradually pass over several days.  Your baby moving (dropping) lower in your pelvis   to get into position for birth (lightening). When this happens, you may feel more pressure on your bladder and pelvic bone and less pressure on your ribs. This  may make it easier to breathe. It may also cause you to need to urinate more often and have problems with bowel movements.  Having "practice contractions," also called Braxton Hicks contractions or false labor. These occur at irregular (unevenly spaced) intervals that are more than 10 minutes apart. False labor contractions are common after exercise or sexual activity. They will stop if you change position, rest, or drink fluids. These contractions are usually mild and do not get stronger over time. They may feel like: ? A backache or back pain. ? Mild cramps, similar to menstrual cramps. ? Tightening or pressure in your abdomen. Other early symptoms include:  Nausea or loss of appetite.  Diarrhea.  Having a sudden burst of energy, or feeling very tired.  Mood changes.  Having trouble sleeping.   Signs and symptoms that labor has begun Signs that you are in labor may include:  Having contractions that come at regular (evenly spaced) intervals and increase in intensity. This may feel like more intense tightening or pressure in your abdomen that moves to your back. ? Contractions may also feel like rhythmic pain in your upper thighs or back that comes and goes at regular intervals. ? For first-time mothers, this change in intensity of contractions often occurs at a more gradual pace. ? Women who have given birth before may notice a more rapid progression of contraction changes.  Feeling pressure in the vaginal area.  Your water breaking (rupture of membranes). This is when the sac of fluid that surrounds your baby breaks. Fluid leaking from your vagina may be clear or blood-tinged. Labor usually starts within 24 hours of your water breaking, but it may take longer to begin. ? Some women may feel a sudden gush of fluid. ? Others notice that their underwear repeatedly becomes damp. Follow these instructions at home:  When labor starts, or if your water breaks, call your health care  provider or nurse care line. Based on your situation, they will determine when you should go in for an exam.  During early labor, you may be able to rest and manage symptoms at home. Some strategies to try at home include: ? Breathing and relaxation techniques. ? Taking a warm bath or shower. ? Listening to music. ? Using a heating pad on the lower back for pain. If you are directed to use heat:  Place a towel between your skin and the heat source.  Leave the heat on for 20-30 minutes.  Remove the heat if your skin turns bright red. This is especially important if you are unable to feel pain, heat, or cold. You may have a greater risk of getting burned.   Contact a health care provider if:  Your labor has started.  Your water breaks. Get help right away if:  You have painful, regular contractions that are 5 minutes apart or less.  Labor starts before you are [redacted] weeks along in your pregnancy.  You have a fever.  You have bright red blood coming from your vagina.  You do not feel your baby moving.  You have a severe headache with or without vision problems.  You have severe nausea, vomiting, or diarrhea.  You have chest pain or shortness of breath. These symptoms may represent a serious problem that is an emergency. Do not wait to see   if the symptoms will go away. Get medical help right away. Call your local emergency services (911 in the [place] Do not drive yourself to the hospital. Summary  Labor is your body's natural process of moving your baby and the placenta out of your uterus.  The process of labor usually starts when your baby is full-term, between 37 and 40 weeks of pregnancy.  When labor starts, or if your water breaks, call your health care provider or nurse care line. Based on your situation, they will determine when you should go in for an exam. This information is not intended to replace advice given to you by your health care provider. Make sure you discuss  any questions you have with your health care provider. Document Revised: 04/09/2020 Document Reviewed: 04/09/2020 Elsevier Patient Education  2021 Elsevier Inc.  

## 2020-09-27 NOTE — MAU Note (Signed)
Pt requesting AROM and pain medication in her IV-explained SVE necessary due to advanced dilatation.Pt agreeable

## 2020-09-27 NOTE — MAU Note (Signed)
Reviewed pain control with pt. Pt states she is considering an epidural and declined IV pain medication at this time. Pt standing at bedside. Well controlled with contractions using paced breathing

## 2020-09-27 NOTE — MAU Note (Signed)
Birthing ball provided-pts contractions increased in intensity-support and breathing support provided.

## 2020-09-27 NOTE — H&P (Signed)
OB ADMISSION/ HISTORY & PHYSICAL:  Admission Date: 09/27/2020  6:53 PM  Admit Diagnosis: Normal Labor  Deborah Larsen is a 25 y.o. female G2P1001 [redacted]w[redacted]d presenting for contractions. Endorses active FM, denies LOF and vaginal bleeding. Ctx began @ 1200  History of current pregnancy: G2P1001   Primary OB Provider: [facility] Patient entered care with [facility] at 6.6 wks.   EDC 10/13/20 by [place]. Anatomy scan:  21 wks, complete w/ anterior placenta.   Last evaluation: 36.1  wks  Significant prenatal events:  Patient Active Problem List   Diagnosis Date Noted  . Postpartum anemia 04/07/2019  . Postpartum hemorrhage 04/06/2019  . Delivery normal 04/06/2019  . Severe pre-eclampsia, with delivery, with current postpartum complication 04/06/2019  . PROM (premature rupture of membranes) 04/05/2019    Prenatal Labs: ABO, Rh:  O Positive Antibody:  Negative Rubella: Nonimmune (09/23 0000)  RPR:   NR HBsAg: Negative (09/23 0000)  HIV: Non-reactive (09/23 0000)  GTT: 81 GBS: Negative/-- (03/18 0000) GC/CHL: Negative/Negative Genetics: Low risk female, AFP WNL Tdap/influenza vaccines: UTD/UTD   OB History  Gravida Para Term Preterm AB Living  2 1 1     1   SAB IAB Ectopic Multiple Live Births        0 1    # Outcome Date GA Lbr Len/2nd Weight Sex Delivery Anes PTL Lv  2 Current           1 Term 04/05/19 [redacted]w[redacted]d 12:48 / 01:53 3430 g M Vag-Spont None  LIV    Medical / Surgical History: Past medical history: No past medical history on file.  Past surgical history:  Past Surgical History:  Procedure Laterality Date  . WISDOM TOOTH EXTRACTION     Family History: No family history on file.  Social History:  reports that she is a non-smoker but has been exposed to tobacco smoke. She has never used smokeless tobacco. She reports that she does not drink alcohol and does not use drugs.  Allergies: Patient has no known allergies.   Current Medications at time of admission:  Prior to  Admission medications   Medication Sig Start Date End Date Taking? Authorizing Provider  acetaminophen (TYLENOL) 325 MG tablet Take 2 tablets (650 mg total) by mouth every 4 (four) hours as needed (for pain scale < 4). 04/08/19   06/08/19, FNP  ferrous sulfate 325 (65 FE) MG EC tablet Take 1 tablet (325 mg total) by mouth 2 (two) times daily. 04/08/19 04/07/20  06/07/20, FNP  Prenatal Vit-Fe Fumarate-FA (PRENATAL MULTIVITAMIN) TABS tablet Take 1 tablet by mouth daily at 12 noon.    [provider]    Review of Systems: Constitutional: Negative   HENT: Negative   Eyes: Negative   Respiratory: Negative   Cardiovascular: Negative   Gastrointestinal: Negative  Genitourinary: negative for bloody show, negative for LOF   Musculoskeletal: Negative   Skin: Negative   Neurological: Negative   Endo/Heme/Allergies: Negative   Psychiatric/Behavioral: Negative    Physical Exam: VS: Blood pressure 115/65, pulse 89, temperature 98.3 F (36.8 C), temperature source Oral, resp. rate 15, SpO2 100 %, unknown if currently breastfeeding. AAO x3, no signs of distress Cardiovascular: RRR Respiratory: Lung fields clear to ausculation GU/GI: Abdomen gravid, non-tender, non-distended, active FM, vertex Extremities: negative edema, negative for pain, tenderness, and cords  Cervical exam:Dilation: 6 Effacement (%): 100 Station: 0 Exam by:: [email or web address] RN FHR: baseline rate 140 / variability moderate / accelerations present / absent decelerations TOCO: irregular  Prenatal Transfer Tool  Maternal Diabetes: No Genetic Screening: Normal Maternal Ultrasounds/Referrals: Normal Fetal Ultrasounds or other Referrals:  None Maternal Substance Abuse:  No Significant Maternal Medications:  None Significant Maternal Lab Results: Group B Strep negative    Assessment: 25 y.o. G2P1001 [redacted]w[redacted]d admitted for spontaneous labor. CAT1.   Active stage of labor FHR category 1 GBS Negative Pain  management plan: IV fentanyl   Plan:  Admit to L&D Routine admission orders Epidural PRN   Dr Connye Burkitt notified of admission and plan of care  Carollee Leitz MSN, CNM 09/27/2020 7:40 PM

## 2020-09-27 NOTE — MAU Note (Signed)
Lost her mucous plug, early this morning, then started contracting, not as close now- like every 7 min.  No bleeding.

## 2020-09-27 NOTE — MAU Note (Signed)
Pt reports IV pain medication effective for pain control. Remains well controlled with breathing

## 2020-09-27 NOTE — MAU Note (Signed)
.  Deborah Larsen is a 25 y.o. at [redacted]w[redacted]d here in MAU reporting: ctx that have gotten closer together that are 4-5 minutes apart. Denies VB or LOF. Endorses good fetal movement,   Pain score: 9

## 2020-09-28 DIAGNOSIS — D62 Acute posthemorrhagic anemia: Secondary | ICD-10-CM | POA: Diagnosis not present

## 2020-09-28 LAB — CBC
HCT: 29.3 % — ABNORMAL LOW (ref 36.0–46.0)
Hemoglobin: 9.8 g/dL — ABNORMAL LOW (ref 12.0–15.0)
MCH: 27.5 pg (ref 26.0–34.0)
MCHC: 33.4 g/dL (ref 30.0–36.0)
MCV: 82.1 fL (ref 80.0–100.0)
Platelets: 187 10*3/uL (ref 150–400)
RBC: 3.57 MIL/uL — ABNORMAL LOW (ref 3.87–5.11)
RDW: 13 % (ref 11.5–15.5)
WBC: 11.5 10*3/uL — ABNORMAL HIGH (ref 4.0–10.5)
nRBC: 0 % (ref 0.0–0.2)

## 2020-09-28 LAB — RPR: RPR Ser Ql: NONREACTIVE

## 2020-09-28 MED ORDER — ONDANSETRON HCL 4 MG/2ML IJ SOLN
4.0000 mg | INTRAMUSCULAR | Status: DC | PRN
Start: 2020-09-28 — End: 2020-09-29

## 2020-09-28 MED ORDER — ZOLPIDEM TARTRATE 5 MG PO TABS: 5.0000 mg | ORAL_TABLET | Freq: Every evening | ORAL | Status: DC | PRN

## 2020-09-28 MED ORDER — POLYSACCHARIDE IRON COMPLEX 150 MG PO CAPS
150.0000 mg | ORAL_CAPSULE | Freq: Every day | ORAL | Status: DC
Start: 2020-09-28 — End: 2020-09-29
  Administered 2020-09-29: 150 mg via ORAL
  Filled 2020-09-28: qty 1

## 2020-09-28 MED ORDER — BENZOCAINE-MENTHOL 20-0.5 % EX AERO
1.0000 "application " | INHALATION_SPRAY | CUTANEOUS | Status: DC | PRN
  Administered 2020-09-28: 1 via TOPICAL
  Filled 2020-09-28: qty 56

## 2020-09-28 MED ORDER — SIMETHICONE 80 MG PO CHEW
80.0000 mg | CHEWABLE_TABLET | ORAL | Status: DC | PRN
Start: 2020-09-28 — End: 2020-09-29

## 2020-09-28 MED ORDER — ONDANSETRON HCL 4 MG PO TABS: 4.0000 mg | ORAL_TABLET | ORAL | Status: DC | PRN

## 2020-09-28 MED ORDER — MAGNESIUM OXIDE 400 (241.3 MG) MG PO TABS
400.0000 mg | ORAL_TABLET | Freq: Every day | ORAL | Status: DC
  Administered 2020-09-29: 400 mg via ORAL
  Filled 2020-09-28: qty 1

## 2020-09-28 MED ORDER — DIBUCAINE (PERIANAL) 1 % EX OINT: 1.0000 "application " | TOPICAL_OINTMENT | CUTANEOUS | Status: DC | PRN

## 2020-09-28 MED ORDER — IBUPROFEN 600 MG PO TABS
600.0000 mg | ORAL_TABLET | Freq: Four times a day (QID) | ORAL | Status: DC
  Administered 2020-09-28 – 2020-09-29 (×7): 600 mg via ORAL
  Filled 2020-09-28 (×7): qty 1

## 2020-09-28 MED ORDER — SENNOSIDES-DOCUSATE SODIUM 8.6-50 MG PO TABS
2.0000 | ORAL_TABLET | Freq: Every day | ORAL | Status: DC
  Administered 2020-09-29: 2 via ORAL
  Filled 2020-09-28: qty 2

## 2020-09-28 MED ORDER — OXYCODONE HCL 5 MG PO TABS: 10.0000 mg | ORAL_TABLET | ORAL | Status: DC | PRN

## 2020-09-28 MED ORDER — COCONUT OIL OIL
1.0000 "application " | TOPICAL_OIL | Status: DC | PRN
  Administered 2020-09-28: 1 via TOPICAL

## 2020-09-28 MED ORDER — DIPHENHYDRAMINE HCL 25 MG PO CAPS: 25.0000 mg | ORAL_CAPSULE | Freq: Four times a day (QID) | ORAL | Status: DC | PRN

## 2020-09-28 MED ORDER — WITCH HAZEL-GLYCERIN EX PADS
1.0000 | MEDICATED_PAD | CUTANEOUS | Status: DC | PRN
Start: 2020-09-28 — End: 2020-09-29

## 2020-09-28 MED ORDER — ACETAMINOPHEN 325 MG PO TABS
650.0000 mg | ORAL_TABLET | ORAL | Status: DC | PRN
  Administered 2020-09-28 (×3): 650 mg via ORAL
  Filled 2020-09-28 (×4): qty 2

## 2020-09-28 MED ORDER — OXYCODONE HCL 5 MG PO TABS: 5.0000 mg | ORAL_TABLET | ORAL | Status: DC | PRN

## 2020-09-28 MED ORDER — PRENATAL MULTIVITAMIN CH
1.0000 | ORAL_TABLET | Freq: Every day | ORAL | Status: DC
  Administered 2020-09-28 – 2020-09-29 (×2): 1 via ORAL
  Filled 2020-09-28 (×2): qty 1

## 2020-09-28 MED ORDER — TETANUS-DIPHTH-ACELL PERTUSSIS 5-2.5-18.5 LF-MCG/0.5 IM SUSY: 0.5000 mL | PREFILLED_SYRINGE | Freq: Once | INTRAMUSCULAR | Status: DC

## 2020-09-28 NOTE — Progress Notes (Signed)
Subjective: Postpartum Day # 1 : S/P NSVD due to pt was admitted on 2/29 at 37.5 weeks for active labor, progressed with AROM, had SVD over 1st degree lacerated perineum with EBL of 200, and starting hgb of 11.9 and pending post hgb. Patient up ad lib, denies syncope or dizziness. Reports consuming regular diet without issues and denies N/V. Patient reports 0 bowel movement + passing flatus.  Denies issues with urination and reports bleeding is "lighter."  Patient is breast and bottle feeding and reports going well.  Desires unknown for postpartum contraception.  Pain is being appropriately managed with use of po meds.   1st laceration Feeding:  Breast and bottle Contraceptive plan:  undecided Baby Female no circ desired.   Objective: Vital signs in last 24 hours: Patient Vitals for the past 24 hrs:  BP Temp Temp src Pulse Resp SpO2 Height Weight  09/28/20 0500 105/73 98.2 F (36.8 C) Oral 66 16 100 % -- --  09/28/20 0045 119/76 98.3 F (36.8 C) Oral 85 16 100 % -- --  09/27/20 2345 109/72 98.9 F (37.2 C) Oral 74 16 100 % -- --  09/27/20 2300 104/65 -- -- 88 -- -- -- --  09/27/20 2245 117/71 -- -- 87 -- -- -- --  09/27/20 2230 115/72 -- -- 96 -- -- -- --  09/27/20 2215 124/71 98.3 F (36.8 C) Oral 85 20 -- 5\' 2"  (1.575 m) 71.8 kg  09/27/20 2011 115/70 -- -- 88 17 -- -- --  09/27/20 1911 115/65 98.3 F (36.8 C) Oral 89 15 100 % -- --     Physical Exam:  General: alert, cooperative, appears stated age and no distress Mood/Affect: happy Lungs: clear to auscultation, no wheezes, rales or rhonchi, symmetric air entry.  Heart: normal rate, regular rhythm, normal S1, S2, no murmurs, rubs, clicks or gallops. Breast: breasts appear normal, no suspicious masses, no skin or nipple changes or axillary nodes. Abdomen:  + bowel sounds, soft, non-tender GU: perineum approximate, healing well. No signs of external hematomas.  Uterine Fundus: firm Lochia: appropriate Skin: Warm, Dry. DVT  Evaluation: No evidence of DVT seen on physical exam. Negative Homan's sign. No cords or calf tenderness. No significant calf/ankle edema.  CBC Latest Ref Rng & Units 09/27/2020 04/07/2019 04/07/2019  WBC 4.0 - 10.5 K/uL 12.0(H) 9.2 10.2  Hemoglobin 12.0 - 15.0 g/dL 11.9(L) 9.2(L) 5.9(LL)  Hematocrit 36.0 - 46.0 % 36.1 26.3(L) 17.5(L)  Platelets 150 - 400 K/uL 212 122(L) 124(L)    Results for orders placed or performed during the hospital encounter of 09/27/20 (from the past 24 hour(s))  Type and screen [facility]     Status: None   Collection Time: 09/27/20  7:40 PM  Result Value Ref Range   ABO/RH(D) O POS    Antibody Screen NEG    Sample Expiration      09/30/2020,2359 Performed at [facility], [address], [place], [place]   Resp Panel by RT-PCR (Flu A&B, Covid) Nasopharyngeal Swab     Status: None   Collection Time: 09/27/20  7:44 PM   Specimen: Nasopharyngeal Swab; Nasopharyngeal(NP) swabs in vial transport medium  Result Value Ref Range   SARS Coronavirus 2 by RT PCR NEGATIVE NEGATIVE   Influenza A by PCR NEGATIVE NEGATIVE   Influenza B by PCR NEGATIVE NEGATIVE  Rapid HIV screen (HIV 1/2 Ab+Ag) ([facility] Only)     Status: None   Collection Time: 09/27/20  7:44 PM  Result Value Ref Range   HIV-1 P24 Antigen - HIV24 NON REACTIVE NON REACTIVE   HIV 1/2 Antibodies NON REACTIVE NON REACTIVE   Interpretation (HIV Ag Ab)      A non reactive test result means that HIV 1 or HIV 2 antibodies and HIV 1 p24 antigen were not detected in the specimen.  CBC     Status: Abnormal   Collection Time: 09/27/20  7:44 PM  Result Value Ref Range   WBC 12.0 (H) 4.0 - 10.5 K/uL   RBC 4.40 3.87 - 5.11 MIL/uL   Hemoglobin 11.9 (L) 12.0 - 15.0 g/dL   HCT 16.1 09.6 - 04.5 %   MCV 82.0 80.0 - 100.0 fL   MCH 27.0 26.0 - 34.0 pg   MCHC 33.0 30.0 - 36.0 g/dL   RDW 40.9 81.1 - 91.4 %   Platelets 212 150 - 400 K/uL   nRBC 0.0 0.0 - 0.2 %     CBG (last 3)  No  results for input(s): GLUCAP in the last 72 hours.   I/O last 3 completed shifts: In: 0  Out: 200 [Blood:200]   Assessment Postpartum Day # 1 : S/P NSVD due to pt was admitted on 2/29 at 37.5 weeks for active labor, progressed with AROM, had SVD over 1st degree lacerated perineum with EBL of 200, and starting hgb of 11.9 and pending post hgb. Pt stable. -1 involution. Breast and bottle feeding. Hemodynamically stable. [place] Female no circ desired.   Plan: Continue other mgmt as ordered VTE prophylactics: Early ambulated as tolerates.  Pain control: Motrin/Tylenol PRN Education given regarding options for contraception, including barrier methods, injectable contraception, IUD placement, oral contraceptives.  Plan for discharge tomorrow, Breastfeeding and Lactation consult   Dr. Normand Sloop to be updated on patient status  [facility] NP-C, CNM 09/28/2020, 7:04 AM

## 2020-09-29 MED ORDER — POLYSACCHARIDE IRON COMPLEX 150 MG PO CAPS: 150.0000 mg | ORAL_CAPSULE | Freq: Every day | ORAL | 2 refills | Status: AC

## 2020-09-29 MED ORDER — IBUPROFEN 600 MG PO TABS: 600.0000 mg | ORAL_TABLET | Freq: Four times a day (QID) | ORAL | 0 refills | Status: AC | PRN

## 2020-09-29 MED ORDER — IBUPROFEN 600 MG PO TABS: 600.0000 mg | ORAL_TABLET | Freq: Four times a day (QID) | ORAL | 0 refills | Status: DC

## 2020-09-29 MED ORDER — POLYSACCHARIDE IRON COMPLEX 150 MG PO CAPS: 150.0000 mg | ORAL_CAPSULE | Freq: Every day | ORAL | 1 refills | Status: DC

## 2020-09-29 NOTE — Discharge Summary (Signed)
Postpartum Discharge Summary      Patient Name: Deborah Larsen DOB: 05-Aug-1995 MRN: [id number]  Date of admission: 09/27/2020 Delivery date:09/27/2020  Delivering provider: Holli Humbles B  Date of discharge: 09/29/2020  Admitting diagnosis: Normal labor and delivery [O80] Intrauterine pregnancy: [redacted]w[redacted]d    Secondary diagnosis:  Active Problems:   Normal labor and delivery   SVD (spontaneous vaginal delivery)   Normal postpartum course   Acute blood loss anemia  Additional problems: na     Discharge diagnosis: Term Pregnancy Delivered                                              Post partum procedures:na Augmentation: AROM Complications: None  Hospital course: Onset of Labor With Vaginal Delivery      25y.o. yo G2P1001 at 361w0das admitted in Active Labor on 09/27/2020. Patient had an uncomplicated labor course as follows:  Membrane Rupture Time/Date: 9:36 PM ,09/27/2020   Delivery Method:Vaginal, Spontaneous  Episiotomy: None  Lacerations:  1st degree;Periurethral  Patient had an uncomplicated postpartum course.  She is ambulating, tolerating a regular diet, passing flatus, and urinating well. Patient is discharged home in stable condition on 09/29/20.  Newborn Data: Birth date:09/27/2020  Birth time:9:53 PM  Gender:Female  Living status:Living  Apgars:8 ,9  Weight:3365 g   Magnesium Sulfate received: No BMZ received: No Rhophylac:N/A MMR:N/A T-DaP:Given prenatally Flu: N/A Transfusion:No  Physical exam  Vitals:   09/28/20 0932 09/28/20 1344 09/28/20 2008 09/29/20 0533  BP: 113/71 103/74 115/69 102/71  Pulse: 73 63 96 61  Resp: 18 14 18 18   Temp: 98 F (36.7 C) 98.6 F (37 C) 98.6 F (37 C) 98.3 F (36.8 C)  TempSrc: Oral Oral Oral Oral  SpO2: 100% 100% 100%   Weight:      Height:       General: alert, cooperative and no distress Lochia: appropriate Uterine Fundus: firm Incision: N/A DVT Evaluation: No evidence of DVT seen on  physical exam. Negative Homan's sign. No cords or calf tenderness. No significant calf/ankle edema. Labs: Lab Results  Component Value Date   WBC 11.5 (H) 09/28/2020   HGB 9.8 (L) 09/28/2020   HCT 29.3 (L) 09/28/2020   MCV 82.1 09/28/2020   PLT 187 09/28/2020   CMP Latest Ref Rng & Units 04/07/2019  Glucose 70 - 99 mg/dL 82  BUN 6 - 20 mg/dL <5(L)  Creatinine 0.44 - 1.00 mg/dL 0.74  Sodium 135 - 145 mmol/L 137  Potassium 3.5 - 5.1 mmol/L 3.6  Chloride 98 - 111 mmol/L 104  CO2 22 - 32 mmol/L 25  Calcium 8.9 - 10.3 mg/dL 6.7(L)  Total Protein 6.5 - 8.1 g/dL 4.8(L)  Total Bilirubin 0.3 - 1.2 mg/dL 0.4  Alkaline Phos 38 - 126 U/L 84  AST 15 - 41 U/L 62(H)  ALT 0 - 44 U/L 28   Edinburgh Score: Edinburgh Postnatal Depression Scale Screening Tool 09/27/2020  I have been able to laugh and see the funny side of things. 3  I have looked forward with enjoyment to things. 0  I have blamed myself unnecessarily when things went wrong. 0  I have been anxious or worried for no good reason. 0  I have felt scared or panicky for no good reason. 0  Things have been getting on top of me. 0  I  have been so unhappy that I have had difficulty sleeping. 0  I have felt sad or miserable. 0  I have been so unhappy that I have been crying. 0  The thought of harming myself has occurred to me. 0  Edinburgh Postnatal Depression Scale Total 3      After visit meds:  Allergies as of 09/29/2020   No Known Allergies     Medication List    STOP taking these medications   acetaminophen 325 MG tablet Commonly known as: Tylenol   aspirin EC 81 MG tablet   ferrous sulfate 325 (65 FE) MG EC tablet   prenatal multivitamin Tabs tablet     TAKE these medications   ibuprofen 600 MG tablet Commonly known as: ADVIL Take 1 tablet (600 mg total) by mouth every 6 (six) hours.   iron polysaccharides 150 MG capsule Commonly known as: NIFEREX Take 1 capsule (150 mg total) by mouth daily.         Discharge home in stable condition Infant Feeding: Bottle and Breast Infant Disposition:home with mother Discharge instruction: per After Visit Summary and Postpartum booklet. Activity: Advance as tolerated. Pelvic rest for 6 weeks.  Diet: routine diet Anticipated Birth Control: Condoms Postpartum Appointment:6 weeks Additional Postpartum F/U: na Future Appointments:No future appointments. Follow up Visit:  Follow-up Information    [facility], [place] Follow up in 6 week(s).   Specialty: Obstetrics and Gynecology Contact information: [address] [place] [address] [phone]                   09/29/2020 Ike Bene, CNM
